# Patient Record
Sex: Female | Born: 1965 | Race: White | Hispanic: No | Smoking: Former smoker
Health system: Southern US, Community
[De-identification: ages and names within clinical notes are randomized; demographics above are authoritative.]

## PROBLEM LIST (undated history)

## (undated) DIAGNOSIS — J189 Pneumonia, unspecified organism: Secondary | ICD-10-CM

## (undated) DIAGNOSIS — E079 Disorder of thyroid, unspecified: Secondary | ICD-10-CM

## (undated) DIAGNOSIS — K219 Gastro-esophageal reflux disease without esophagitis: Secondary | ICD-10-CM

## (undated) DIAGNOSIS — F32A Depression, unspecified: Secondary | ICD-10-CM

## (undated) DIAGNOSIS — L900 Lichen sclerosus et atrophicus: Secondary | ICD-10-CM

## (undated) DIAGNOSIS — M549 Dorsalgia, unspecified: Secondary | ICD-10-CM

## (undated) DIAGNOSIS — M1990 Unspecified osteoarthritis, unspecified site: Secondary | ICD-10-CM

## (undated) DIAGNOSIS — N903 Dysplasia of vulva, unspecified: Secondary | ICD-10-CM

## (undated) HISTORY — DX: Dysplasia of vulva, unspecified: N90.3

## (undated) HISTORY — PX: ABDOMINOPLASTY: SUR9

## (undated) HISTORY — PX: MENISCUS REPAIR: SHX5179

## (undated) HISTORY — PX: ECTOPIC PREGNANCY SURGERY: SHX613

## (undated) HISTORY — PX: ENDOMETRIAL ABLATION: SHX621

## (undated) HISTORY — PX: TUBAL LIGATION: SHX77

## (undated) HISTORY — DX: Dorsalgia, unspecified: M54.9

## (undated) HISTORY — DX: Disorder of thyroid, unspecified: E07.9

## (undated) HISTORY — DX: Lichen sclerosus et atrophicus: L90.0

---

## 1995-11-24 HISTORY — PX: ECTOPIC PREGNANCY SURGERY: SHX613

## 2002-11-23 HISTORY — PX: MENISCUS REPAIR: SHX5179

## 2011-11-24 HISTORY — PX: ENDOMETRIAL ABLATION: SHX621

## 2011-11-24 HISTORY — PX: TUBAL LIGATION: SHX77

## 2015-01-07 ENCOUNTER — Emergency Department (HOSPITAL_COMMUNITY)
Admission: EM | Admit: 2015-01-07 | Discharge: 2015-01-07 | Disposition: A | Discharge status: Home / Self Care | Payer: Managed Care, Other (non HMO) | Attending: Emergency Medicine | Admitting: Emergency Medicine

## 2015-01-07 ENCOUNTER — Encounter (HOSPITAL_COMMUNITY): Payer: Self-pay | Admitting: Emergency Medicine

## 2015-01-07 DIAGNOSIS — Y9289 Other specified places as the place of occurrence of the external cause: Secondary | ICD-10-CM | POA: Diagnosis not present

## 2015-01-07 DIAGNOSIS — Y9389 Activity, other specified: Secondary | ICD-10-CM | POA: Insufficient documentation

## 2015-01-07 DIAGNOSIS — Y998 Other external cause status: Secondary | ICD-10-CM | POA: Diagnosis not present

## 2015-01-07 DIAGNOSIS — X58XXXA Exposure to other specified factors, initial encounter: Secondary | ICD-10-CM | POA: Insufficient documentation

## 2015-01-07 DIAGNOSIS — T7840XA Allergy, unspecified, initial encounter: Secondary | ICD-10-CM | POA: Insufficient documentation

## 2015-01-07 MED ORDER — SODIUM CHLORIDE 0.9 % IV BOLUS (SEPSIS)
1000.0000 mL | Freq: Once | INTRAVENOUS | Status: AC
  Administered 2015-01-07: 1000 mL via INTRAVENOUS

## 2015-01-07 MED ORDER — PREDNISONE 20 MG PO TABS: 40.0000 mg | ORAL_TABLET | Freq: Every day | ORAL | Status: DC

## 2015-01-07 MED ORDER — HYDROXYZINE HCL 25 MG PO TABS: 25.0000 mg | ORAL_TABLET | Freq: Four times a day (QID) | ORAL | Status: DC | PRN

## 2015-01-07 MED ORDER — DIPHENHYDRAMINE HCL 50 MG/ML IJ SOLN
50.0000 mg | Freq: Once | INTRAMUSCULAR | Status: AC
  Administered 2015-01-07: 50 mg via INTRAVENOUS
  Filled 2015-01-07: qty 1

## 2015-01-07 MED ORDER — FAMOTIDINE 20 MG PO TABS: 20.0000 mg | ORAL_TABLET | Freq: Two times a day (BID) | ORAL | Status: DC

## 2015-01-07 MED ORDER — FAMOTIDINE IN NACL 20-0.9 MG/50ML-% IV SOLN
20.0000 mg | Freq: Once | INTRAVENOUS | Status: AC
  Administered 2015-01-07: 20 mg via INTRAVENOUS
  Filled 2015-01-07: qty 50

## 2015-01-07 MED ORDER — METHYLPREDNISOLONE SODIUM SUCC 125 MG IJ SOLR
125.0000 mg | Freq: Once | INTRAMUSCULAR | Status: AC
  Administered 2015-01-07: 125 mg via INTRAVENOUS
  Filled 2015-01-07: qty 2

## 2015-01-07 NOTE — Progress Notes (Signed)
  CARE MANAGEMENT ED NOTE 01/07/2015  Patient:  Willetts,Nikaya   Account Number:  [id number]  Date Initiated:  01/07/2015  Documentation initiated by:  Edd ArbourGIBBS,KIMBERLY  Subjective/Objective Assessment:   49 yr old [employer] managed allergic reaction to something-states she feels her ears and eyes are swelling and feels her throat is irritated-itching all over-states she finished Bactrim three days ago for a cyst-also states she is feeling like     Subjective/Objective Assessment Detail:   no  pcp  pt states she use to work at [employer] now at [employer]     Action/Plan:   spoke with pt see notes below   Action/Plan Detail:   Anticipated DC Date:  01/07/2015     Status Recommendation to Physician:   Result of Recommendation:    Other ED Services  Consult Working Plan    DC Planning Services  Other  Outpatient Services - Pt will follow up  PCP issues    Choice offered to / List presented to:            Status of service:  Completed, signed off  ED Comments:   ED Comments Detail:  [facility] ED CM spoke with pt on how to obtain an in network pcp with insurance coverage via the customer service number or web site Cm reviewed ED level of care for crisis/emergent services and community pcp level of care to manage continuous or chronic medical concerns.  The pt voiced understanding CM encouraged pt and discussed pt's responsibility to verify with pt's insurance carrier that any recommended medical provider offered by any emergency room or a hospital provider is within the carrier's network. The pt voiced understanding

## 2015-01-07 NOTE — Progress Notes (Deleted)
[facility] to check to see if pt had medicare or medicaid services prior to d/c after re reviewing CM notes from 01/03/15

## 2015-01-07 NOTE — ED Provider Notes (Signed)
CSN: [id number]     Arrival date & time 01/07/15  6213 History   First MD Initiated Contact with Patient 01/07/15 [phone]     Chief Complaint  Patient presents with  . Allergic Reaction     (Consider location/radiation/quality/duration/timing/severity/associated sxs/prior Treatment) HPI  Carolyn White is a 49 y.o. female complaining of diffuse pruritus and hives onset yesterday she woke up this morning and the rash and pruritus had worsened in addition she is having eye swelling and she describes a sore throat similar to when you have a cold however she has no rhinorrhea, fever, chills, other respiratory symptoms. She also states that she feels like she has a basketball in her epigastrium, she denies nausea, vomiting, dyspepsia, shortness of breath or wheezing. No prior history of anaphylactic reaction. Patient finished a ten-day course of Bactrim for a pilonidal cyst 3 days ago.  History reviewed. No pertinent past medical history. History reviewed. No pertinent past surgical history. No family history on file. History  Substance Use Topics  . Smoking status: Not on file  . Smokeless tobacco: Not on file  . Alcohol Use: Not on file   OB History    No data available     Review of Systems  10 systems reviewed and found to be negative, except as noted in the HPI.   Allergies  Review of patient's allergies indicates not on file.  Home Medications   Prior to Admission medications   Not on File   BP 155/89 mmHg  Pulse 94  Temp(Src) 98.1 F (36.7 C)  Resp 16  SpO2 100% Physical Exam  Constitutional: She is oriented to person, place, and time. She appears well-developed and well-nourished. No distress.  HENT:  Head: Normocephalic and atraumatic.  Mouth/Throat: Oropharynx is clear and moist.  Eyes: Conjunctivae and EOM are normal. Pupils are equal, round, and reactive to light.  Neck: Normal range of motion.  Cardiovascular: Normal rate, regular rhythm and intact distal pulses.    Pulmonary/Chest: Effort normal and breath sounds normal. No stridor. No respiratory distress. She has no wheezes. She has no rales. She exhibits no tenderness.  No stridor or drooling. No posterior pharynx edema, lip or tongue swelling. Pt reclining comfortably, speaking in complete sentences.   No Wheezing, excellent air movement in all fields.     Abdominal: Soft. Bowel sounds are normal. She exhibits no distension and no mass. There is no tenderness. There is no rebound and no guarding.  Musculoskeletal: Normal range of motion.  Neurological: She is alert and oriented to person, place, and time.  Skin: Skin is warm and dry. Rash noted.  Hives over torso and all 4 extremities.   No warmth, TTP or discharge. Lesions are blanchable; sparing palms, soles and mucous membranes   Psychiatric: She has a normal mood and affect. Her behavior is normal.  Nursing note and vitals reviewed.   ED Course  Procedures (including critical care time) Labs Review Labs Reviewed - No data to display  Imaging Review No results found.   EKG Interpretation None      MDM   Final diagnoses:  Allergic reaction, initial encounter    Filed Vitals:   01/07/15 0836  BP: 155/89  Pulse: 94  Temp: 98.1 F (36.7 C)  Resp: 16  SpO2: 100%    Medications  sodium chloride 0.9 % bolus 1,000 mL (1,000 mLs Intravenous New Bag/Given 01/07/15 0906)  famotidine (PEPCID) IVPB 20 mg (20 mg Intravenous New Bag/Given 01/07/15 0906)  methylPREDNISolone sodium  succinate (SOLU-MEDROL) 125 mg/2 mL injection 125 mg (125 mg Intravenous Given 01/07/15 0906)  diphenhydrAMINE (BENADRYL) injection 50 mg (50 mg Intravenous Given 01/07/15 0906)    Carolyn White is a pleasant 49 y.o. female presenting with worsening hives, eye swelling. She reports a sore throat, allergen unclear she finished a ten-day course of Bactrim 3 days ago. Plan is IV fluids, Solu-Medrol and Pepcid in addition to Benadryl.  Patient reassessed after  medications and fluid bolus, she reports that she is sleepy but feels much better, rash has essentially resolved, lung sounds remained clear to auscultation, airways remains intact. Patient states that she drove herself here, we'll keep her in the ED and she until she is more alert and able to drive.   Patient's husband will come to pick her up.  Evaluation does not show pathology that would require ongoing emergent intervention or inpatient treatment. Pt is hemodynamically stable and mentating appropriately. Discussed findings and plan with patient/guardian, who agrees with care plan. All questions answered. Return precautions discussed and outpatient follow up given.   New Prescriptions   FAMOTIDINE (PEPCID) 20 MG TABLET    Take 1 tablet (20 mg total) by mouth 2 (two) times daily.   HYDROXYZINE (ATARAX/VISTARIL) 25 MG TABLET    Take 1 tablet (25 mg total) by mouth every 6 (six) hours as needed for itching.   PREDNISONE (DELTASONE) 20 MG TABLET    Take 2 tablets (40 mg total) by mouth daily.         Wynetta Emeryicole Nohea Kras, PA-C 01/07/15 1101  Suzi RootsKevin E Steinl, MD 01/08/15 [phone]

## 2015-01-07 NOTE — ED Notes (Addendum)
Per pt, states she is having a allergic reaction to something-states she feels her ears and eyes are swelling and feels her throat is irritated-itching all over-states she finished Bactrim three days ago for a cyst-also states she is feeling like she was getting a cold Thurs and Fri

## 2015-01-07 NOTE — Discharge Instructions (Signed)
You may apply a topical hydrocortisone ointment to all affected areas except for the face.   Do not hesitate to call 911 or return to the emergency room if you develop any shortness of breath, wheezing, tongue or lip swelling.  Do not hesitate to return to the emergency room for any new, worsening or concerning symptoms.  Please obtain primary care using resource guide below. But the minute you were seen in the emergency room and that they will need to obtain records for further outpatient management.    Emergency Department Resource Guide 1) Find a Doctor and Pay Out of Pocket Although you won't have to find out who is covered by your insurance plan, it is a good idea to ask around and get recommendations. You will then need to call the office and see if the doctor you have chosen will accept you as a new patient and what types of options they offer for patients who are self-pay. Some doctors offer discounts or will set up payment plans for their patients who do not have insurance, but you will need to ask so you aren't surprised when you get to your appointment.  2) Contact Your Local Health Department Not all health departments have doctors that can see patients for sick visits, but many do, so it is worth a call to see if yours does. If you don't know where your local health department is, you can check in your phone book. The CDC also has a tool to help you locate your state's health department, and many state websites also have listings of all of their local health departments.  3) Find a Walk-in Clinic If your illness is not likely to be very severe or complicated, you may want to try a walk in clinic. These are popping up all over the country in pharmacies, drugstores, and shopping centers. They're usually staffed by nurse practitioners or physician assistants that have been trained to treat common illnesses and complaints. They're usually fairly quick and inexpensive. However, if you have  serious medical issues or chronic medical problems, these are probably not your best option.  No Primary Care Doctor: - Call Health Connect at  [phone] - they can help you locate a primary care doctor that  accepts your insurance, provides certain services, etc. - Physician Referral Service- [phone]  Chronic Pain Problems: Organization         Address  Phone   Notes  [facility]  [phone] Patients need to be referred by their primary care doctor.   Medication Assistance: Organization         Address  Phone   Notes  [facility] Medication [facility] [address] [place], [place] [id number] [phone] --Must be a resident of [facility] -- Must have NO insurance coverage whatsoever (no Medicaid/ Medicare, etc.) -- The pt. MUST have a primary care doctor that directs their care regularly and follows them in the community   MedAssist  [phone]   [employer]  [phone]    Agencies that provide inexpensive medical care: Organization         Address  Phone   Notes  [facility]  [phone]   [facility]    [phone]   [facility] [address] [place], [place] [address] [phone]   [facility] [address], [place] [phone]   Planned Parenthood    (  [phone]   [facility]    [phone]   [facility]  [address], [place] Phone:  [phone], Fax:  [phone] Hours of Operation:  9 am - 6 pm, M-F.  Also accepts Medicaid/Medicare and self-pay.  [facility] for [place], [address], [place] Phone: [phone], Fax: [phone]. Hours of Operation:  8:30 am - 5:30 pm, M-F.  Also accepts Medicaid and self-pay.  [facility] [address] [place] Phone: [phone]   [place],  [place], [place] [phone], Ext. [phone] Mondays & Thursdays: 7-9 AM.  First 15 patients are seen on a first come, first serve basis.    [place] Providers:  Organization         Address  Phone   Notes  [facility] [address], [place] [phone] Also accepts self-pay patients.  [facility] [id number] [place], [place]  [phone]   [place], [address], [place] [phone]   [facility] [address], [place] [phone]   Lucianne Lei [address], [place]   [phone] Only accepts [place] Access [place] patients after they have their name applied to their card.   Self-Pay (no insurance) in [facility]:  Organization         Address  Phone   Notes  Sickle Cell Patients, [facility] [place] [phone]   [facility] [place] [phone]   [facility] [place]  [place], [place], [place] [phone]   [facility]/Dr. Osei-Bonsu  [address], [place] or [address], [place] [phone] Phone number for both [place] and [place] locations is the same.  Urgent Medical and [facility] [address] [place] [phone]   [facility] [address] [place] or [address] [phone]   [facility] [address] [place] [phone], phone; [phone], fax Sees patients 1st and 3rd Saturday of every month.  Must not qualify for public or private insurance (i.e. Medicaid, Medicare, [place] Health Choice, Veterans' Benefits)  Household income should be no more than 200% of the poverty level The clinic cannot treat you if you are pregnant or think you are pregnant   Sexually transmitted diseases are not treated at the clinic.    Dental Care: Organization         Address  Phone  Notes  [facility] [phone] Accepts children up to age 37 who are enrolled in [place] or [place]; pregnant women with a Medicaid card; and children who have applied for Medicaid or [place] Health Choice, but were declined, whose parents can pay a reduced fee at time of service.  [facility]  [address], [place] [phone] Accepts children up to age 15 who are enrolled in [place] or [place]; pregnant women with a Medicaid card; and children who have applied for Medicaid or [place], but were declined, whose parents can pay a  reduced fee at time of service.  [place] Adult Dental Access PROGRAM  [place] [phone] Patients are seen by appointment only. Walk-ins are not accepted. [place] will see patients 74 years of age and older. Monday - Tuesday (8am-5pm) Most Wednesdays (8:30-5pm) $30 per visit, cash only  [facility] Adult Dental Access PROGRAM  [address], [facility] [phone] Patients are seen by appointment only. Walk-ins are not accepted. [place] will see patients 35 years of age and older. One Wednesday Evening (Monthly: Volunteer Based).  $30 per visit, cash only  [place]  [phone] for adults; Children under age 59, call Graduate Pediatric Dentistry at [phone]. Children aged 51-14, please call [phone] to request a pediatric application.  Dental services are provided in all areas of dental care including fillings, crowns and bridges, complete and partial dentures, implants, gum treatment, root canals, and extractions. Preventive care is also provided. Treatment is provided to both adults and children. Patients  are selected via a lottery and there is often a waiting list.   [facility] [address] [place]  [phone] [email or web address]   [facility] [address] [place], [place] [phone], Ext. [phone] Second and Fourth Thursday of each month, opens at 6:30 AM; Clinic ends at 9 AM.  Patients are seen on a first-come first-served basis, and a limited number are seen during each clinic.   [facility]  [address] Hillard Danker [place], [place] [phone]   Eligibility Requirements You must have lived in [place], [place], or [place] counties for at least the last three months.   You cannot be eligible for state or federal sponsored [employer], including [employer], [place], or [employer].   You generally cannot be eligible for healthcare insurance through your employer.    How to apply: Eligibility screenings are held every Tuesday and Wednesday afternoon from 1:00 pm until 4:00 pm. You do not need an appointment for the interview!  [facility] [address] [place], [place]   [place]  [place] Department  [place]  [phone]    Behavioral Health Resources in the Community: Intensive Outpatient Programs Organization         Address  Phone  Notes  [place]. [address], [place], [place] [phone]   [facility]patient [address], [place], [place]   ADS: Alcohol & Drug Svcs [address] [place], [place]   Centre [address],  [place], [place] or [phone]   Substance Abuse Resources Organization         Address  Phone  Notes  Alcohol and Drug Services  7871029858   [place]  [phone]   The [place]   Chinita Pester  [phone]    Residential & Outpatient Substance Abuse Program  [phone]   Psychological Services Organization         Address  Phone  Notes  [facility]  [place]  [phone]   [place] [address], [place] or [phone]    Mobile Crisis Teams Organization         Address  Phone  Notes  [facility], Mobile Crisis Care Unit  [phone]  Assertive Psychotherapeutic Services  [address] [place], [place]   [facility] [address] [place] [phone]    Self-Help/Support Groups Organization         Address  Phone             Notes  [facility] [place] - variety of support groups  [place] Call for more information  Narcotics Anonymous (NA), Caring Services [address], [employer] [place]  2 meetings at this location   Special educational needs teacher         Address  Phone  Notes  ASAP Residential Treatment [place],    [place]  [phone]   [facility]  [address] [place] [phone], [place], [place]   [place], [place] [phone] Admissions: 8am-3pm M-F  Incentives Substance [place] [address],    [place], [place] [phone]   [facility] [address] [place], [place], [place]   [facility] [address]  [place], [place]   [facility] - Intensive Outpatient [address], [place], [place]   [facility] ([place].) [place].,  [place], [place] [phone] or [phone]   [facility] Treatment Services (RTS) [address], [place], [place] Accepts Medicaid  [facility] [place] [address]  [place] [phone] Substance Abuse/Addiction Treatment   [facility] Organization         Address  Phone  Notes  [facility]  [phone]   Domenic Schwab, PhD [address] Arlis Porta [place], [place]   [phone] or [phone]   [place], [place] [phone]   [facility] [address] [place], [place] [phone] Insurance/Medicaid/sponsorship through [facility] and Families [address], [address] [place]                                    [place], [place] [phone] [place] [address], [place] [phone]    Dr. Adele Schilder  [phone]   [facility] Dept. 1) [address], [place] 2) [place] 3)  [place] [address], [place] [phone]  [phone]   [place] [phone] or [phone] (After Hours)

## 2019-08-03 ENCOUNTER — Other Ambulatory Visit: Payer: Self-pay

## 2019-08-03 ENCOUNTER — Encounter (HOSPITAL_COMMUNITY): Payer: Self-pay | Admitting: Emergency Medicine

## 2019-08-03 ENCOUNTER — Ambulatory Visit (HOSPITAL_COMMUNITY)
Admission: EM | Admit: 2019-08-03 | Discharge: 2019-08-03 | Disposition: A | Discharge status: Home / Self Care | Payer: Self-pay | Attending: Internal Medicine | Admitting: Internal Medicine

## 2019-08-03 ENCOUNTER — Ambulatory Visit (INDEPENDENT_AMBULATORY_CARE_PROVIDER_SITE_OTHER): Payer: Self-pay

## 2019-08-03 DIAGNOSIS — R197 Diarrhea, unspecified: Secondary | ICD-10-CM | POA: Insufficient documentation

## 2019-08-03 LAB — COMPREHENSIVE METABOLIC PANEL
ALT: 18 U/L (ref 0–44)
AST: 25 U/L (ref 15–41)
Albumin: 4.2 g/dL (ref 3.5–5.0)
Alkaline Phosphatase: 74 U/L (ref 38–126)
Anion gap: 9 (ref 5–15)
BUN: 8 mg/dL (ref 6–20)
CO2: 27 mmol/L (ref 22–32)
Calcium: 9.6 mg/dL (ref 8.9–10.3)
Chloride: 103 mmol/L (ref 98–111)
Creatinine, Ser: 0.83 mg/dL (ref 0.44–1.00)
GFR calc Af Amer: >60 mL/min (ref >60)
GFR calc non Af Amer: >60 mL/min (ref >60)
Glucose, Bld: 104 mg/dL — ABNORMAL HIGH (ref 70–99)
Potassium: 4.3 mmol/L (ref 3.5–5.1)
Sodium: 139 mmol/L (ref 135–145)
Total Bilirubin: 1.3 mg/dL — ABNORMAL HIGH (ref 0.3–1.2)
Total Protein: 7.4 g/dL (ref 6.5–8.1)

## 2019-08-03 LAB — CBC WITH DIFFERENTIAL/PLATELET
Abs Immature Granulocytes: 0.04 10*3/uL (ref 0.00–0.07)
Basophils Absolute: 0 10*3/uL (ref 0.0–0.1)
Basophils Relative: 0 %
Eosinophils Absolute: 0.4 10*3/uL (ref 0.0–0.5)
Eosinophils Relative: 4 %
HCT: 45.4 % (ref 36.0–46.0)
Hemoglobin: 15.2 g/dL — ABNORMAL HIGH (ref 12.0–15.0)
Immature Granulocytes: 0 %
Lymphocytes Relative: 29 %
Lymphs Abs: 2.8 10*3/uL (ref 0.7–4.0)
MCH: 30.4 pg (ref 26.0–34.0)
MCHC: 33.5 g/dL (ref 30.0–36.0)
MCV: 90.8 fL (ref 80.0–100.0)
Monocytes Absolute: 0.9 10*3/uL (ref 0.1–1.0)
Monocytes Relative: 9 %
Neutro Abs: 5.6 10*3/uL (ref 1.7–7.7)
Neutrophils Relative %: 58 %
Platelets: 340 10*3/uL (ref 150–400)
RBC: 5 MIL/uL (ref 3.87–5.11)
RDW: 12.2 % (ref 11.5–15.5)
WBC: 9.8 10*3/uL (ref 4.0–10.5)
nRBC: 0 % (ref 0.0–0.2)

## 2019-08-03 LAB — POCT URINALYSIS DIP (DEVICE)
Bilirubin Urine: NEGATIVE
Glucose, UA: NEGATIVE mg/dL
Ketones, ur: NEGATIVE mg/dL
Leukocytes,Ua: NEGATIVE
Nitrite: NEGATIVE
Protein, ur: NEGATIVE mg/dL
Specific Gravity, Urine: >=1.03 (ref 1.005–1.030)
Urobilinogen, UA: 0.2 mg/dL (ref 0.0–1.0)
pH: 5.5 (ref 5.0–8.0)

## 2019-08-03 MED ORDER — KETOROLAC TROMETHAMINE 30 MG/ML IJ SOLN
30.0000 mg | Freq: Once | INTRAMUSCULAR | Status: AC
Start: 2019-08-03 — End: 2019-08-03
  Administered 2019-08-03: 30 mg via INTRAMUSCULAR

## 2019-08-03 MED ORDER — DICYCLOMINE HCL 20 MG PO TABS: 20.0000 mg | ORAL_TABLET | Freq: Two times a day (BID) | ORAL | 0 refills | Status: DC

## 2019-08-03 MED ORDER — ONDANSETRON 4 MG PO TBDP
4.0000 mg | ORAL_TABLET | Freq: Once | ORAL | Status: AC
  Administered 2019-08-03: 4 mg via ORAL

## 2019-08-03 MED ORDER — ONDANSETRON 4 MG PO TBDP: 4.0000 mg | ORAL_TABLET | Freq: Three times a day (TID) | ORAL | 0 refills | Status: DC | PRN

## 2019-08-03 MED ORDER — KETOROLAC TROMETHAMINE 30 MG/ML IJ SOLN
INTRAMUSCULAR | Status: AC
  Filled 2019-08-03: qty 1

## 2019-08-03 MED ORDER — ONDANSETRON 4 MG PO TBDP
ORAL_TABLET | ORAL | Status: AC
  Filled 2019-08-03: qty 1

## 2019-08-03 NOTE — ED Notes (Signed)
Stool sample in lab

## 2019-08-03 NOTE — Discharge Instructions (Addendum)
Your x-ray did not show any problems. I am still waiting on your lab results that are pending. We will send her stool for testing. Give you something here for pain and nausea. Sending [place] a medicine to the pharmacy to help with abdominal cramping Once I get your lab results back we may be treating you with antibiotics based on what we find in the stool. Make sure you are drinking fluids with electrolytes to stay hydrated Small meals if able but nothing spicy, greasy or fatty.

## 2019-08-03 NOTE — ED Notes (Signed)
Patient in bathroom obtaining stool sample

## 2019-08-03 NOTE — ED Triage Notes (Signed)
diarrhea and vomiting for 3 weeks.  Diarrhea and pain has been particularly bad in the last 3 days.  Water stool over the past 3 days.  Patient is hurting in abdomen, generalized. Denies urinary symptoms.   No fever Patient has had chills.   Diarrhea- reports 6 episodes in the last hour  Vomiting-2 episodes, but remains nauseated

## 2019-08-03 NOTE — ED Provider Notes (Signed)
[place]    CSN: [id number] Arrival date & time: 08/03/19  1001      History   Chief Complaint Chief Complaint  Patient presents with  . Diarrhea  . Vomiting    HPI Carolyn White is a 53 y.o. female.   Patient is a 53 year old female with no significant past medical history.  She is presenting today with vomiting and diarrhea x3 weeks.  The problem has been waxing and waning.  The problem has worsened over the last 3 days.  Reporting watery stools and severe abdominal cramping.  No associated fever but has had some mild body aches and chills.  She has had 6 episodes of watery diarrhea in the last hour.  One episode here today.  2 episodes of vomiting today with continued nausea.  Denies any blood in vomit or stool.  She has had loss of appetite but has been sipping fluids.  Denies any recent traveling, recent sick contacts or recent antibiotic use.  ROS per HPI      History reviewed. No pertinent past medical history.  There are no active problems to display for this patient.   Past Surgical History:  Procedure Laterality Date  . CESAREAN SECTION    . ECTOPIC PREGNANCY SURGERY    . ENDOMETRIAL ABLATION    . MENISCUS REPAIR Left   . TUBAL LIGATION      OB History   No obstetric history on file.      Home Medications    Prior to Admission medications   Medication Sig Start Date End Date Taking? Authorizing Provider  famotidine (PEPCID) 20 MG tablet Take 1 tablet (20 mg total) by mouth 2 (two) times daily. 01/07/15  Yes Pisciotta, Elmyra Ricks, PA-C  dicyclomine (BENTYL) 20 MG tablet Take 1 tablet (20 mg total) by mouth 2 (two) times daily. 08/03/19   Tailyn Hantz, Tressia Miners A, NP  ondansetron (ZOFRAN ODT) 4 MG disintegrating tablet Take 1 tablet (4 mg total) by mouth every 8 (eight) hours as needed for nausea or vomiting. 08/03/19   Orvan July, NP    Family History Family History  Problem Relation Age of Onset  . Irritable bowel syndrome Mother     Social  History Social History   Tobacco Use  . Smoking status: Current Every Day Smoker  Substance Use Topics  . Alcohol use: Never    Frequency: Never  . Drug use: Never     Allergies   Bactrim [sulfamethoxazole-trimethoprim]   Review of Systems Review of Systems   Physical Exam Triage Vital Signs ED Triage Vitals  Enc Vitals Group     BP 08/03/19 1016 131/82     Pulse Rate 08/03/19 1016 83     Resp 08/03/19 1016 16     Temp 08/03/19 1016 98.1 F (36.7 C)     Temp Source 08/03/19 1016 Temporal     SpO2 08/03/19 1016 100 %     Weight --      Height --      Head Circumference --      Peak Flow --      Pain Score 08/03/19 1021 6     Pain Loc --      Pain Edu? --      Excl. in [place]? --    No data found.  Updated Vital Signs BP 131/82 (BP Location: Left Arm)   Pulse 83   Temp 98.1 F (36.7 C) (Temporal)   Resp 16   SpO2 100%  Visual Acuity Right Eye Distance:   Left Eye Distance:   Bilateral Distance:    Right Eye Near:   Left Eye Near:    Bilateral Near:     Physical Exam Constitutional:      General: She is not in acute distress.    Appearance: She is ill-appearing. She is not toxic-appearing or diaphoretic.  HENT:     Head: Normocephalic and atraumatic.     Nose: Nose normal.  Eyes:     Conjunctiva/sclera: Conjunctivae normal.  Neck:     Musculoskeletal: Normal range of motion.  Cardiovascular:     Rate and Rhythm: Normal rate and regular rhythm.     Pulses: Normal pulses.     Heart sounds: Normal heart sounds.  Pulmonary:     Effort: Pulmonary effort is normal.     Breath sounds: Normal breath sounds.  Abdominal:     General: Abdomen is flat. Bowel sounds are normal. There is no distension. There are no signs of injury.     Palpations: Abdomen is soft. There is no shifting dullness, fluid wave, hepatomegaly, splenomegaly, mass or pulsatile mass.     Tenderness: There is generalized abdominal tenderness. There is no right CVA tenderness, left CVA  tenderness, guarding or rebound. Negative signs include Murphy's sign.     Hernia: No hernia is present.  Neurological:     Mental Status: She is alert.      UC Treatments / Results  Labs (all labs ordered are listed, but only abnormal results are displayed) Labs Reviewed  CBC WITH DIFFERENTIAL/PLATELET - Abnormal; Notable for the following components:      Result Value   Hemoglobin 15.2 (*)    All other components within normal limits  COMPREHENSIVE METABOLIC PANEL - Abnormal; Notable for the following components:   Glucose, Bld 104 (*)    Total Bilirubin 1.3 (*)    All other components within normal limits  POCT URINALYSIS DIP (DEVICE) - Abnormal; Notable for the following components:   Hgb urine dipstick TRACE (*)    All other components within normal limits  GASTROINTESTINAL PANEL BY PCR, STOOL (REPLACES STOOL CULTURE)    EKG   Radiology Dg Abd 2 Views  Result Date: 08/03/2019 CLINICAL DATA:  Nausea, vomiting and diarrhea 3 weeks. Evaluate for bowel obstruction. EXAM: ABDOMEN - 2 VIEW COMPARISON:  None. FINDINGS: Examination demonstrates air throughout the colon with a few scattered colonic air-fluid levels. No evidence of dilated small bowel loops. No free peritoneal air. Bones and soft tissues are unremarkable. IMPRESSION: Nonobstructive bowel gas pattern. Electronically Signed   By: Elberta Fortis M.D.   On: 08/03/2019 11:08    Procedures Procedures (including critical care time)  Medications Ordered in UC Medications  ketorolac (TORADOL) 30 MG/ML injection 30 mg (30 mg Intramuscular Given 08/03/19 1157)  ondansetron (ZOFRAN-ODT) disintegrating tablet 4 mg (4 mg Oral Given 08/03/19 1157)  ketorolac (TORADOL) 30 MG/ML injection (has no administration in time range)  ondansetron (ZOFRAN-ODT) 4 MG disintegrating tablet (has no administration in time range)    Initial Impression / Assessment and Plan / UC Course  I have reviewed the triage vital signs and the nursing  notes.  Pertinent labs & imaging results that were available during my care of the patient were reviewed by me and considered in my medical decision making (see chart for details).  Clinical Course as of Aug 05 847  Fri Aug 04, 2019  1556 MCV: 90.8 [TB]  1607 Cyclospora cayetanensis: NOT DETECTED [  TB]    Clinical Course User Index [TB] Janace ArisBast, Lindell Renfrew A, NP    Pt is a 53 year old female that presents with 3 weeks of worsening watery diarrhea and abdominal cramping.  X ray normal without obstruction.  Lab work unremarkable Stool sample with cryptosporidium in stool.  Spoke with ID and they recommended no treatment. This is typically self limiting and treat the symptoms. Bentyl given for symptoms and recommended trying imodium. Stay hydrated   Spoke with patient on the phone about potential sources. Sources are reportable to the health department.  If symptoms worsen she will need to follow up. Sometimes ivermectin is warranted.   Final Clinical Impressions(s) / UC Diagnoses   Final diagnoses:  Diarrhea of presumed infectious origin     Discharge Instructions     Your x-ray did not show any problems. I am still waiting on your lab results that are pending. We will send her stool for testing. Give you something here for pain and nausea. Sending us a medicine to the pharmacy to help with abdominal cramping Once I get your lab results back we may be treating you with antibiotics based on what we find in the stool. Make sure you are drinking fluids with electrolytes to stay hydrated Small meals if able but nothing spicy, greasy or fatty.     ED Prescriptions    Medication Sig Dispense Auth. Provider   dicyclomine (BENTYL) 20 MG tablet Take 1 tablet (20 mg total) by mouth 2 (two) times daily. 20 tablet Latrelle Bazar A, NP   ondansetron (ZOFRAN ODT) 4 MG disintegrating tablet Take 1 tablet (4 mg total) by mouth every 8 (eight) hours as needed for nausea or vomiting. 20 tablet Janace ArisBast,  Erian Rosengren A, NP     Controlled Substance Prescriptions [place] Controlled Substance Registry consulted? no   Janace ArisBast, Taha Dimond A, NP 08/05/19 [phone]

## 2019-08-03 NOTE — ED Notes (Signed)
Patient requested bathroom, requested urine specimen

## 2019-08-04 ENCOUNTER — Telehealth (HOSPITAL_COMMUNITY): Payer: Self-pay

## 2019-08-04 LAB — GASTROINTESTINAL PANEL BY PCR, STOOL (REPLACES STOOL CULTURE)

## 2020-12-23 ENCOUNTER — Other Ambulatory Visit: Payer: Self-pay

## 2020-12-23 ENCOUNTER — Encounter (HOSPITAL_COMMUNITY): Payer: Self-pay

## 2020-12-23 ENCOUNTER — Emergency Department (HOSPITAL_COMMUNITY): Payer: No Typology Code available for payment source

## 2020-12-23 ENCOUNTER — Emergency Department (HOSPITAL_COMMUNITY)
Admission: EM | Admit: 2020-12-23 | Discharge: 2020-12-23 | Disposition: A | Discharge status: Home / Self Care | Payer: No Typology Code available for payment source | Attending: Emergency Medicine | Admitting: Emergency Medicine

## 2020-12-23 DIAGNOSIS — S0993XA Unspecified injury of face, initial encounter: Secondary | ICD-10-CM | POA: Diagnosis not present

## 2020-12-23 DIAGNOSIS — W01198A Fall on same level from slipping, tripping and stumbling with subsequent striking against other object, initial encounter: Secondary | ICD-10-CM | POA: Insufficient documentation

## 2020-12-23 DIAGNOSIS — S0990XA Unspecified injury of head, initial encounter: Secondary | ICD-10-CM

## 2020-12-23 DIAGNOSIS — F17200 Nicotine dependence, unspecified, uncomplicated: Secondary | ICD-10-CM | POA: Insufficient documentation

## 2020-12-23 DIAGNOSIS — M542 Cervicalgia: Secondary | ICD-10-CM | POA: Diagnosis not present

## 2020-12-23 DIAGNOSIS — Y9302 Activity, running: Secondary | ICD-10-CM | POA: Insufficient documentation

## 2020-12-23 MED ORDER — CYCLOBENZAPRINE HCL 10 MG PO TABS: 10.0000 mg | ORAL_TABLET | Freq: Two times a day (BID) | ORAL | 0 refills | Status: DC | PRN

## 2020-12-23 MED ORDER — CYCLOBENZAPRINE HCL 10 MG PO TABS
20.0000 mg | ORAL_TABLET | Freq: Once | ORAL | Status: AC
  Administered 2020-12-23: 20 mg via ORAL
  Filled 2020-12-23: qty 2
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
## 2020-12-23 NOTE — Discharge Instructions (Addendum)
seen here after a head injury.  Exam and imaging all looks reassuring.  Given you a a prescription for a muscle relaxer please take as prescribed.  Please beware this medication can cause you to become drowsy do consume alcohol or operate heavy machinery while taking this medication.  I recommend taking this at nighttime.  You may take over-the-counter pain medications like ibuprofen or Tylenol every 6 hours as needed.  I recommend brain rest, I would decrease screen time exercising and other brain stimulating activities as this can increase headaches.  I would slowly reintroduce these activities as tolerated.  You may follow-up with the concussion clinic and/or your primary care provider if symptoms not fully resolved in a week's time.  Come back to the emergency department if you develop chest pain, shortness of breath, severe abdominal pain, uncontrolled nausea, vomiting, diarrhea.
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
## 2021-12-28 ENCOUNTER — Encounter (HOSPITAL_COMMUNITY): Payer: Self-pay | Admitting: Emergency Medicine

## 2021-12-28 ENCOUNTER — Emergency Department (HOSPITAL_COMMUNITY)
Admission: EM | Admit: 2021-12-28 | Discharge: 2021-12-28 | Disposition: A | Discharge status: Home / Self Care | Payer: No Typology Code available for payment source | Attending: Emergency Medicine | Admitting: Emergency Medicine

## 2021-12-28 ENCOUNTER — Emergency Department (HOSPITAL_COMMUNITY): Payer: No Typology Code available for payment source

## 2021-12-28 ENCOUNTER — Other Ambulatory Visit: Payer: Self-pay

## 2021-12-28 DIAGNOSIS — W208XXA Other cause of strike by thrown, projected or falling object, initial encounter: Secondary | ICD-10-CM | POA: Insufficient documentation

## 2021-12-28 DIAGNOSIS — S92531A Displaced fracture of distal phalanx of right lesser toe(s), initial encounter for closed fracture: Secondary | ICD-10-CM | POA: Diagnosis not present

## 2021-12-28 DIAGNOSIS — S99921A Unspecified injury of right foot, initial encounter: Secondary | ICD-10-CM | POA: Diagnosis present

## 2021-12-28 DIAGNOSIS — S92501A Displaced unspecified fracture of right lesser toe(s), initial encounter for closed fracture: Secondary | ICD-10-CM

## 2021-12-28 MED ORDER — CEPHALEXIN 500 MG PO CAPS: 500.0000 mg | ORAL_CAPSULE | Freq: Four times a day (QID) | ORAL | 0 refills | Status: DC

## 2021-12-28 NOTE — ED Triage Notes (Signed)
Around 5pm, a 30 lbs log fell on the patient's right foot. The blood is controlled and swelling is noted.

## 2021-12-28 NOTE — ED Provider Notes (Signed)
[facility]-EMERGENCY DEPT Provider Note   CSN: [id number] Arrival date & time: 12/28/21  2104     History  No chief complaint on file.   Carolyn White is a 56 y.o. female who presents emergency department complaining of right foot pain after a piece of lumber fell on it in her garage at around 5 PM.  No other trauma noted.  HPI   History reviewed. No pertinent past medical history.   Home Medications Prior to Admission medications   Medication Sig Start Date End Date Taking? Authorizing Provider  cephALEXin (KEFLEX) 500 MG capsule Take 1 capsule (500 mg total) by mouth 4 (four) times daily. 12/28/21  Yes Kabe Mckoy T, PA-C  cyclobenzaprine (FLEXERIL) 10 MG tablet Take 1 tablet (10 mg total) by mouth 2 (two) times daily as needed for muscle spasms. 12/23/20   Carroll Sage, PA-C  dicyclomine (BENTYL) 20 MG tablet Take 1 tablet (20 mg total) by mouth 2 (two) times daily. 08/03/19   Dahlia Byes A, NP  famotidine (PEPCID) 20 MG tablet Take 1 tablet (20 mg total) by mouth 2 (two) times daily. 01/07/15   Pisciotta, Joni Reining, PA-C  ondansetron (ZOFRAN ODT) 4 MG disintegrating tablet Take 1 tablet (4 mg total) by mouth every 8 (eight) hours as needed for nausea or vomiting. 08/03/19   Janace Aris, NP      Allergies    Bactrim [sulfamethoxazole-trimethoprim]    Review of Systems   Review of Systems  Musculoskeletal:        Right foot pain  All other systems reviewed and are negative.  Physical Exam Updated Vital Signs BP 130/81    Pulse 68    Temp 98.4 F (36.9 C) (Oral)    Resp 18    SpO2 95%  Physical Exam Vitals and nursing note reviewed.  Constitutional:      Appearance: Normal appearance.  HENT:     Head: Normocephalic and atraumatic.  Eyes:     Conjunctiva/sclera: Conjunctivae normal.  Pulmonary:     Effort: Pulmonary effort is normal. No respiratory distress.  Musculoskeletal:     Comments: Ecchymoses noted to the palmar aspect of the right  fourth and fifth toes.  Ecchymosis noted to the dorsal aspect of the second through fifth toes.  Superficial laceration on the palmar aspect of the fourth toe, non-gaping.  Good capillary refill, sensation intact.  Decreased passive ROM of all toes due to pain.  Skin:    General: Skin is warm and dry.     Comments: Minimal bleeding noted from laceration on the fourth toe  Neurological:     Mental Status: She is alert.  Psychiatric:        Mood and Affect: Mood normal.        Behavior: Behavior normal.    ED Results / Procedures / Treatments   Labs (all labs ordered are listed, but only abnormal results are displayed) Labs Reviewed - No data to display  EKG None  Radiology DG Foot Complete Right  Result Date: 12/28/2021 CLINICAL DATA:  30 pound log fell on right foot. Laceration between the fourth and fifth toes. EXAM: RIGHT FOOT COMPLETE - 3+ VIEW COMPARISON:  None. FINDINGS: Fractures of the distal tufts of the fourth and fifth toes. No involvement of the inter phalangeal joints. The other bones of the foot appear unremarkable. Incidental plantar calcaneal spur. IMPRESSION: Fractures of the distal tufts of the fourth and fifth toes. Electronically Signed   By: Loraine Leriche  Shogry M.D.   On: 12/28/2021 22:13    Procedures Procedures    Medications Ordered in ED Medications - No data to display  ED Course/ Medical Decision Making/ A&P                           Medical Decision Making Risk Prescription drug management.   This patient is a 56 year old female who presents to the ED for concern of right foot pain after a piece of lumber about 30 pounds fell on it around 5 PM.  Physical Exam: Physical exam performed. The pertinent findings include: Ecchymosis noted to all toes, decreased passive ROM of all toes due to pain, good capillary refill and sensation, small laceration to the palmar aspect of the fourth toe.  Lab Tests/Imaging studies: I Ordered, and personally interpreted  labs/imaging including foot x-ray.  The pertinent results include: Fractures of the distal tuft of the fourth and fifth right toes. I agree with the radiologist interpretation.  Disposition: After consideration of the diagnostic results and the patients response to treatment, I feel that patient is not requiring mission or inpatient treatment for her symptoms.  Laceration does not appear large or gaping enough to require suture repair.  Provided basic wound care, and place patient in orthotic shoe.  Due to location of the laceration, and concern for difficulty keeping this area clean, will place patient on antibiotics to prevent any infection.  She has no comorbidities which would alter wound healing.  She is to follow-up with the orthopedic doctor later this week.  Discussed reasons to return to the emergency department, and the patient is agreeable to the plan.   Final Clinical Impression(s) / ED Diagnoses Final diagnoses:  Injury of right foot, initial encounter  Closed fracture of phalanx of right fifth toe, initial encounter  Closed fracture of phalanx of right fourth toe, initial encounter    Rx / DC Orders ED Discharge Orders          Ordered    cephALEXin (KEFLEX) 500 MG capsule  4 times daily        12/28/21 2242           Portions of this report may have been transcribed using voice recognition software. Every effort was made to ensure accuracy; however, inadvertent computerized transcription errors may be present.    Jeanella Flattery 12/28/21 2248    Mancel Bale, MD 12/29/21 1330

## 2021-12-28 NOTE — Discharge Instructions (Addendum)
You are seen in the emergency department today for foot pain.  As we discussed you broke your right fourth and fifth toes.  I have dressed your laceration, and we have placed your foot in orthotic shoe. I'd like you to try and keep the area cleaned and bandaged. I've prescribed you antibiotics to prevent infection in that wound, so please take this four times daily for the next 5 days.   Please use Tylenol or ibuprofen for pain.  You may use 600 mg ibuprofen every 6 hours or 1000 mg of Tylenol every 6 hours.  You may choose to alternate between the 2.  This would be most effective.  Do not exceed 4 g of Tylenol within 24 hours.  Do not exceed 3200 mg ibuprofen within 24 hours.  I've given you the contact information for the orthopedic doctors for you to call and make an appointment this week for follow up.

## 2022-10-03 IMAGING — CR DG FOOT COMPLETE 3+V*R*
3 series · 3 of 3 positions shown · non-contrast
Comparison: None.

CLINICAL DATA: 30 pound log fell on right foot. Laceration between
the fourth and fifth toes.

EXAM:
RIGHT FOOT COMPLETE - 3+ VIEW

[x foot ap right]
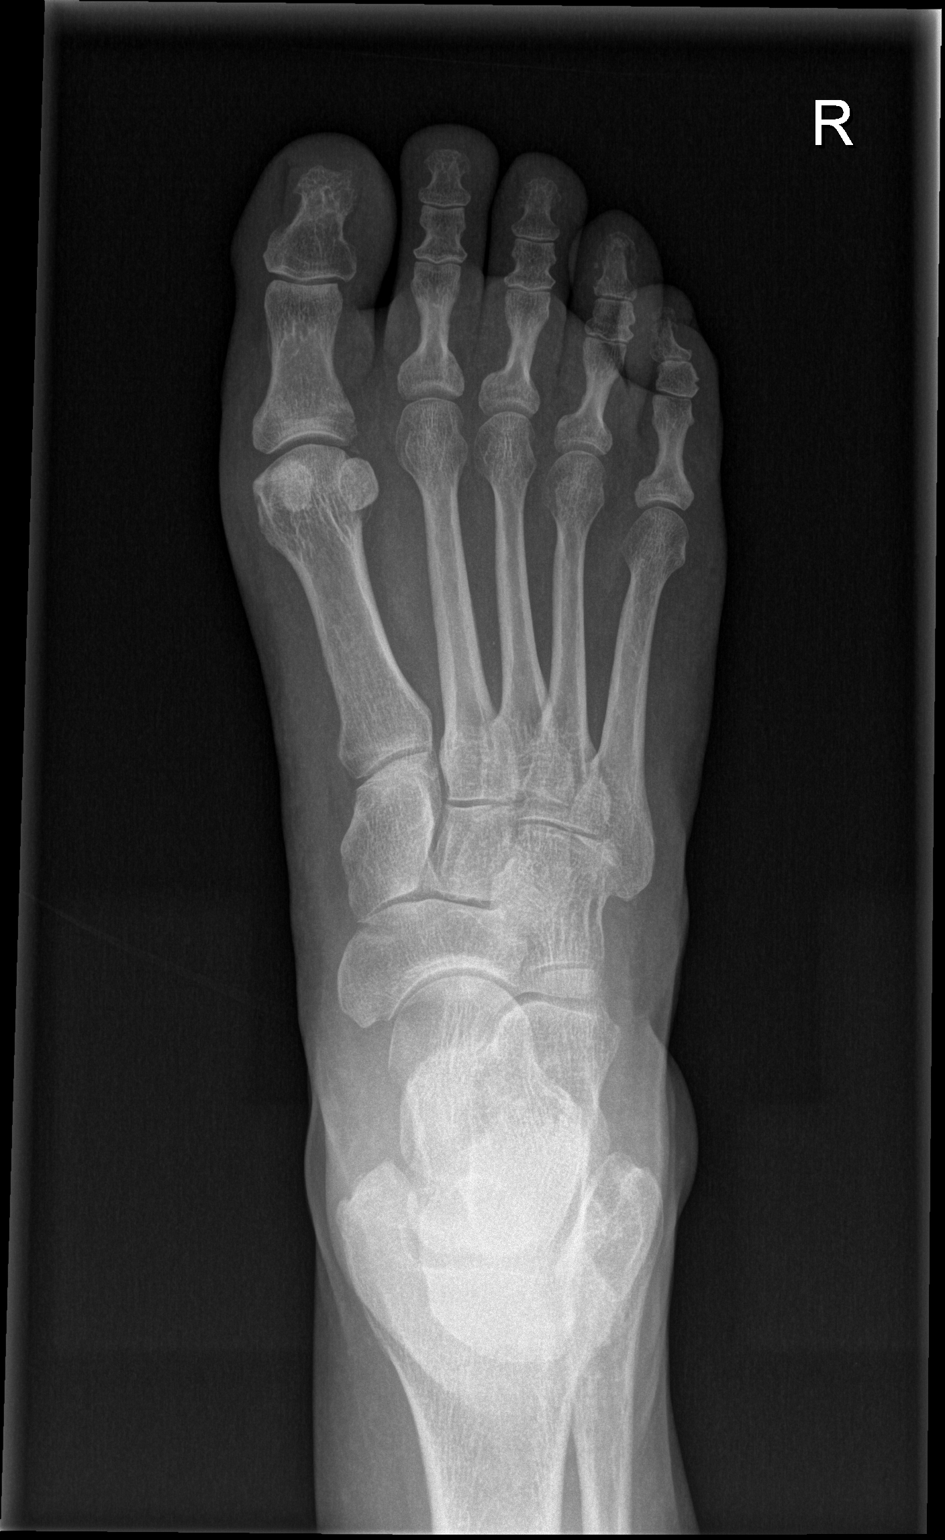

[x foot obl right]
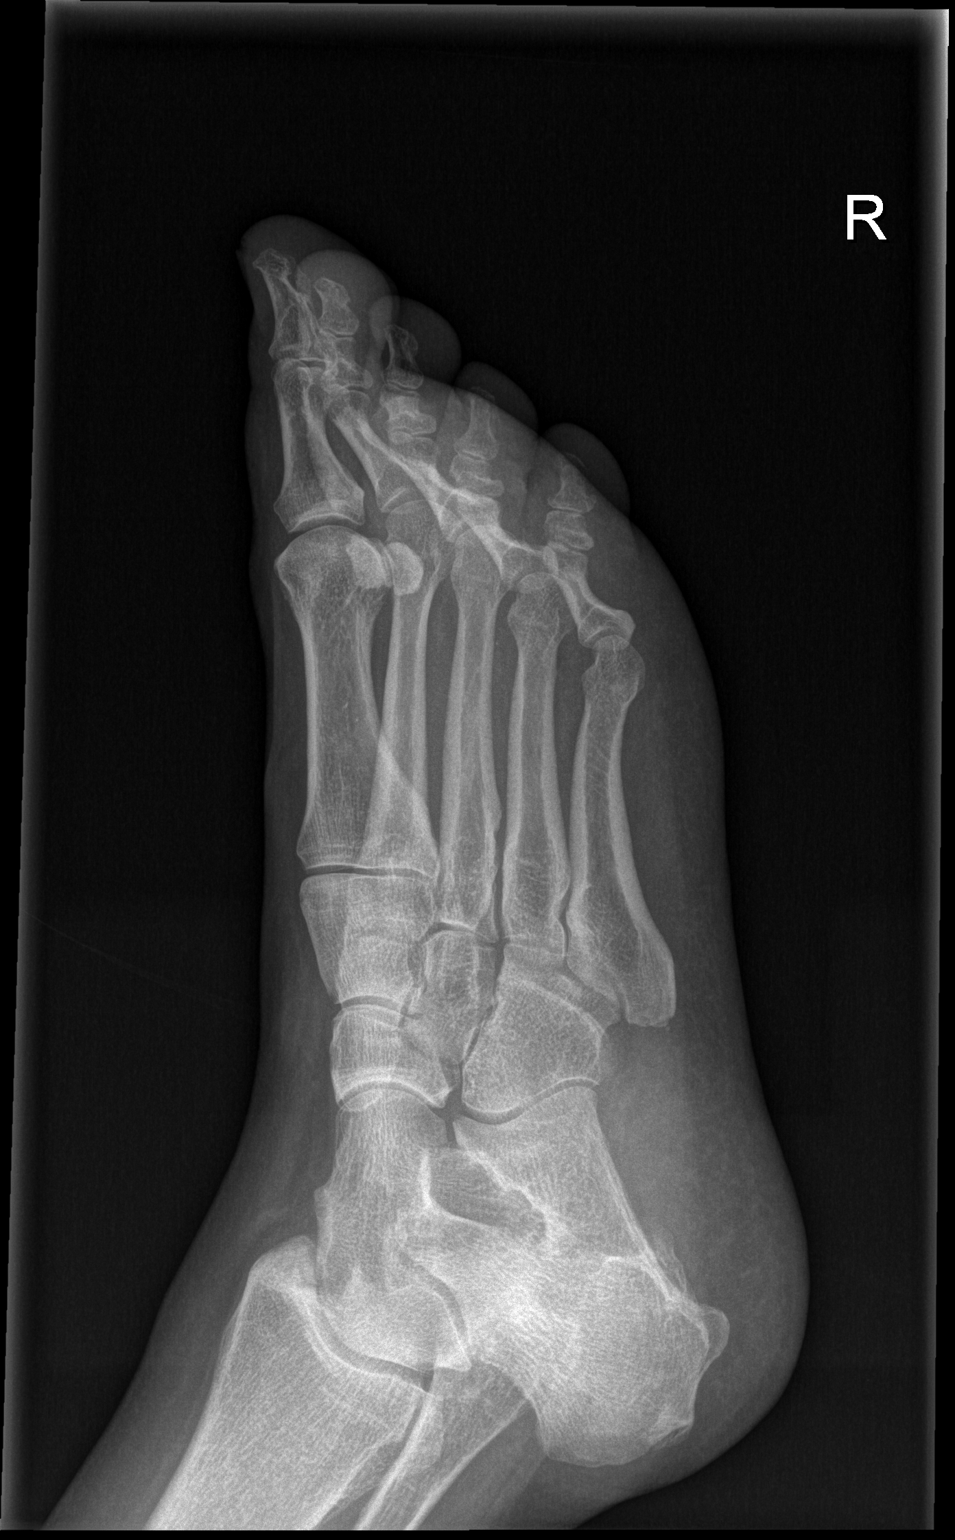

[x foot lat right]
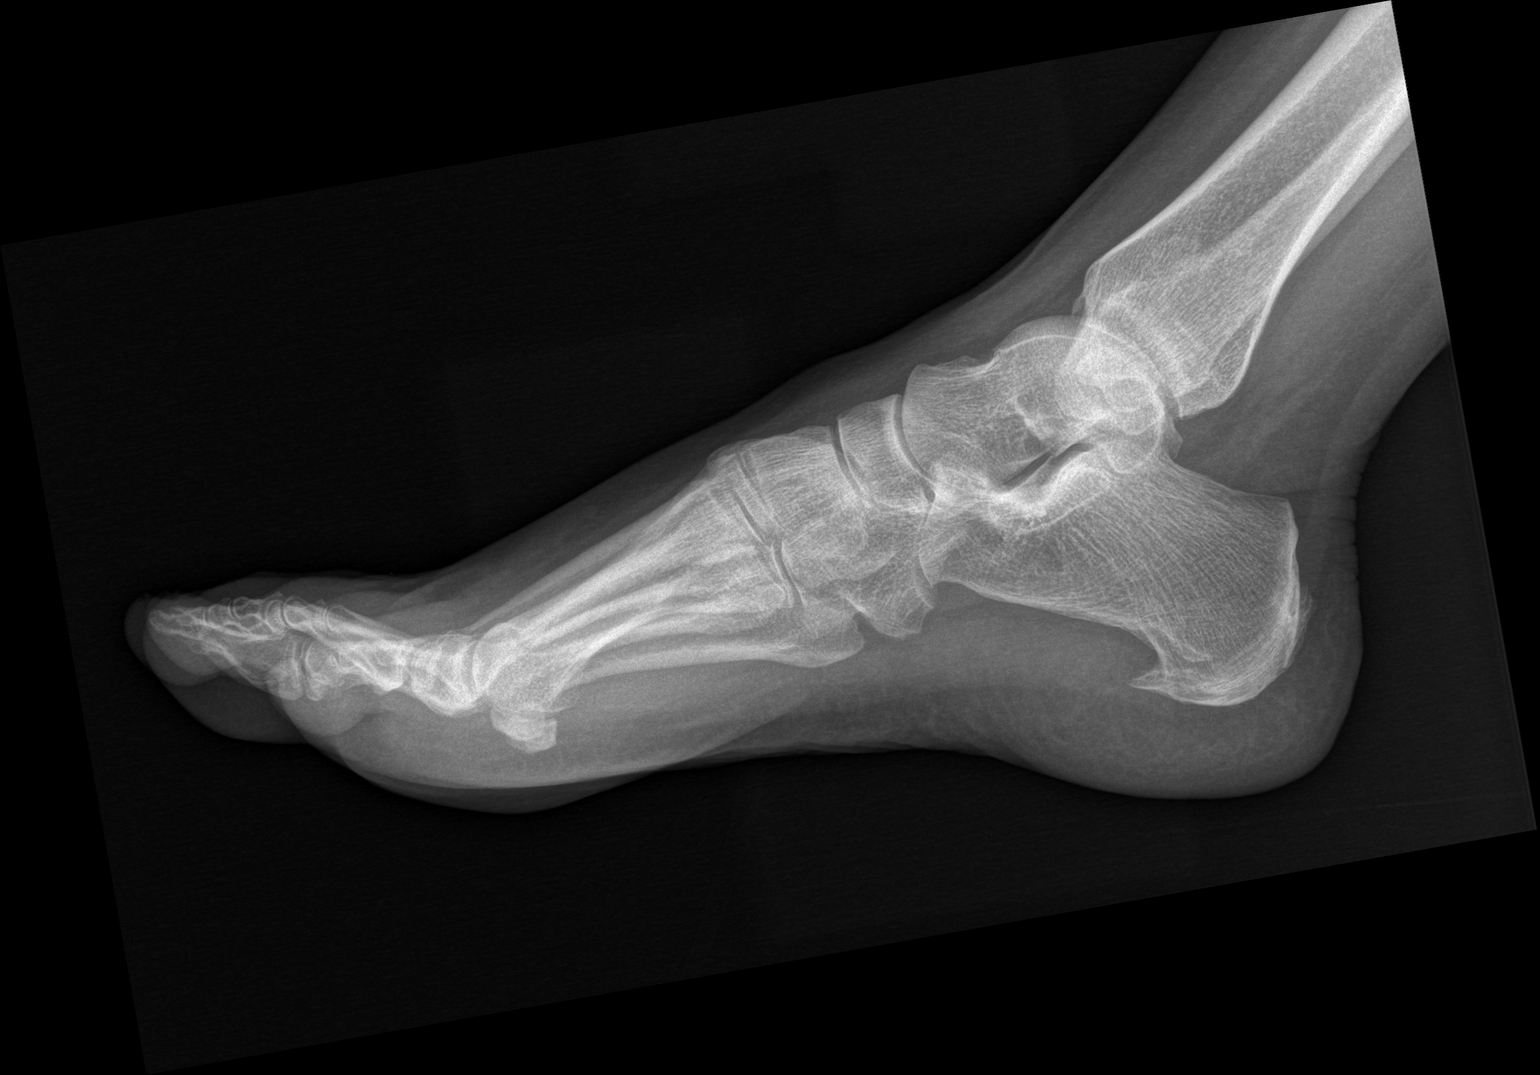

[3 of 3 positions shown; findings below may reference images not displayed]

FINDINGS: Fractures of the distal [REDACTED] of the fourth and fifth toes. No
involvement of the inter phalangeal joints. The other bones of the
foot appear unremarkable. Incidental plantar calcaneal spur.
IMPRESSION: Fractures of the distal [REDACTED] of the fourth and fifth toes.

## 2023-03-07 DIAGNOSIS — N904 Leukoplakia of vulva: Secondary | ICD-10-CM | POA: Insufficient documentation

## 2023-04-28 ENCOUNTER — Other Ambulatory Visit: Payer: Self-pay | Admitting: Obstetrics and Gynecology

## 2023-04-28 DIAGNOSIS — D259 Leiomyoma of uterus, unspecified: Secondary | ICD-10-CM

## 2023-06-08 ENCOUNTER — Ambulatory Visit
Admission: RE | Admit: 2023-06-08 | Discharge: 2023-06-08 | Disposition: A | Discharge status: Home / Self Care | Payer: No Typology Code available for payment source | Source: Ambulatory Visit | Attending: Obstetrics and Gynecology | Admitting: Obstetrics and Gynecology

## 2023-06-08 DIAGNOSIS — D259 Leiomyoma of uterus, unspecified: Secondary | ICD-10-CM

## 2023-06-08 MED ORDER — GADOPICLENOL 0.5 MMOL/ML IV SOLN
9.0000 mL | Freq: Once | INTRAVENOUS | Status: AC | PRN
  Administered 2023-06-08: 9 mL via INTRAVENOUS

## 2023-06-16 ENCOUNTER — Telehealth: Payer: Self-pay

## 2023-06-16 NOTE — Telephone Encounter (Signed)
Spoke with the patient regarding the referral to GYN oncology. Patient scheduled as new patient with Dr Pricilla Holm on 07/02/2023. Patient given an arrival time of 8:30am.  Explained to the patient the the doctor will perform a pelvic exam at this visit. Patient given the policy that only one visitor allowed and that visitor must be over 16 yrs are allowed in the Cancer Center. Patient given the address/phone number for the clinic and that the center offers free valet service. Patient aware that masks are option.

## 2023-06-29 ENCOUNTER — Encounter: Payer: Self-pay | Admitting: Gynecologic Oncology

## 2023-07-01 ENCOUNTER — Encounter: Payer: Self-pay | Admitting: Gynecologic Oncology

## 2023-07-01 ENCOUNTER — Inpatient Hospital Stay: Payer: No Typology Code available for payment source | Attending: Gynecologic Oncology

## 2023-07-01 DIAGNOSIS — Z803 Family history of malignant neoplasm of breast: Secondary | ICD-10-CM | POA: Insufficient documentation

## 2023-07-01 DIAGNOSIS — Z79890 Hormone replacement therapy (postmenopausal): Secondary | ICD-10-CM | POA: Insufficient documentation

## 2023-07-01 DIAGNOSIS — E039 Hypothyroidism, unspecified: Secondary | ICD-10-CM | POA: Insufficient documentation

## 2023-07-01 DIAGNOSIS — Z87410 Personal history of cervical dysplasia: Secondary | ICD-10-CM | POA: Insufficient documentation

## 2023-07-01 DIAGNOSIS — G8929 Other chronic pain: Secondary | ICD-10-CM | POA: Insufficient documentation

## 2023-07-01 DIAGNOSIS — Z809 Family history of malignant neoplasm, unspecified: Secondary | ICD-10-CM | POA: Insufficient documentation

## 2023-07-01 DIAGNOSIS — D398 Neoplasm of uncertain behavior of other specified female genital organs: Secondary | ICD-10-CM | POA: Insufficient documentation

## 2023-07-01 DIAGNOSIS — Z79899 Other long term (current) drug therapy: Secondary | ICD-10-CM | POA: Insufficient documentation

## 2023-07-01 DIAGNOSIS — N950 Postmenopausal bleeding: Secondary | ICD-10-CM | POA: Insufficient documentation

## 2023-07-01 DIAGNOSIS — Z87412 Personal history of vulvar dysplasia: Secondary | ICD-10-CM | POA: Insufficient documentation

## 2023-07-01 DIAGNOSIS — A630 Anogenital (venereal) warts: Secondary | ICD-10-CM | POA: Insufficient documentation

## 2023-07-01 DIAGNOSIS — M549 Dorsalgia, unspecified: Secondary | ICD-10-CM | POA: Insufficient documentation

## 2023-07-01 NOTE — Progress Notes (Signed)
GYNECOLOGIC ONCOLOGY NEW PATIENT CONSULTATION   Patient Name: Carolyn White  Patient Age: 57 y.o. Date of Service: 07/02/23 Referring Provider: Sherian Rein, MD  Primary Care Provider: [facility] Consulting Provider: Eugene Garnet, MD   Assessment/Plan:  Postmenopausal patient with lower uterine segment/upper cervical mass.  I reviewed recent MRI with the patient.  We looked at these pictures together.  She has an approximately 2.5 cm mass within the upper portion of her cervix that has some findings concerning for possible malignancy.  We also discussed benign conditions like a lower uterine segment or cervical fibroid that are also on the differential diagnosis.  Patient has a history of HPV related dysplasia.  Per her report, she underwent excisional procedure almost 10 years ago on her cervix.  I do not know that she has had close follow-up but denies any other abnormal Pap smear since then.  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  These findings indicate a history of HPV related disease which certainly puts her at risk for cervical dysplasia and malignancy.  Because of the location of this mass, I am unable to access it on exam today.  Her pelvic exam is essentially normal.  Her recent endometrial biopsy did not show any endometrial tissue but did have benign endocervical tissue.  Additional endocervical biopsy was deferred today given these results.  We discussed that the treatment for cervical cancer would be quite different than if this was a lower uterine segment or cervical fibroid.  It would be important for [place] to determine this prior to making any sort of surgical plan if at all possible.  Will sometimes a PET scan can be helpful in differentiating malignant from benign conditions, in the setting of a fibroid, PET scan may still appear positive and I do not know that it will add much  to the MRI.  Given its location, I think a transcervical biopsy would be possible.  This would best be done with interventional radiology likely under CT or ultrasound guidance.  Spoke with one of our interventional radiologist today and will place the order for this to be scheduled.  The patient also signed release of records so that we can get her prior Pap and LEEP history as well as her C-section reports.  Most likely, given my exam today, if biopsy shows an endocervical cancer, I think the patient will be a candidate for surgical treatment.  The route of surgery and type of surgery would be different if this is an endocervical cancer compared to a fibroid.  The patient will require a return visit to have further surgical counseling once we have a diagnosis.  In terms of her vulvar dysplasia history, discussed that repeat excision showed no residual dysplasia.  She will need continued close surveillance with her OB/GYN for this with visits every 6 months initially.  A copy of this note was sent to the patient's referring provider.   60 minutes of total time was spent for this patient encounter, including preparation, face-to-face counseling with the patient and coordination of care, and documentation of the encounter.  Eugene Garnet, MD  Division of Gynecologic Oncology  Department of Obstetrics and Gynecology  [facility]  ___________________________________________  Chief Complaint: Chief Complaint  Patient presents with   Cervical mass    History of Present Illness:  Carolyn White is a 57 y.o. y.o. female who is seen in consultation  at the request of Dr. Hinton Rao for an evaluation of a cervical/LUS mass.  The patient was recently seen by OB/GYN for vulvar lesion and postmenopausal bleeding.  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 This biopsy was performed at her new patient visit.  04/21/23: Returned for follow-up for larger vulvar excision and  endometrial biopsy in the setting of PMB. EMB -fragments of negative ecto and endocervical tissue, predominantly mucus.  No endometrial tissue identified.  Excision of vulvar lesion shows benign skin having reactive changes consistent with prior biopsy.  No residual dysplasia or malignancy.  Ultrasound from [facility] on 04/21/2023: There is measures 5.8 x 3.9 x 2.7 cm with an endometrial lining of 1.5 mm.  Bilateral ovaries normal in appearance.  There is a 2.4 cm heterogenous focal mass with increased vascularity in the lower uterine segment/cervix.  Possible subserosal fibroid versus cervical mass.  Pelvic MRI on 7/16 shows a 2 x 2.4 x 2.6 cm enhancing lesion along the right lateral aspect of the lower uterine segment/cervix.  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  Adjacent susceptibility artifact related to low anterior C-section.  No pelvic ascites.  Normal ovaries.  No evidence of metastatic disease.  The patient reports overall doing well.  Her history is notable for abnormal Pap smears with a LEEP performed in 2015 per her report.  She endorses having a Pap smear in 2023 or 2024 that was normal.  Her menses stopped in 2013 after her endometrial ablation.  She recently had a couple of days prior to seeing an OB/GYN earlier this year where she had some light spotting.  She denies any further bleeding.  She endorses some minimal pelvic pain intermittently over the last 6 months.  She reports regular bowel bladder function.  She endorses a good appetite without recent weight changes.  The patient has a history of chronic lower back pain.  She follows with pain management in [place].  She is scheduled to have a nerve ablation procedure at the end of the month.  PAST MEDICAL HISTORY:  Past Medical History:  Diagnosis Date   Back pain    Lichen sclerosus    Thyroid disease    Vulvar dysplasia      PAST SURGICAL HISTORY:  Past Surgical History:  Procedure Laterality Date    ABDOMINOPLASTY     CESAREAN SECTION  1998   and in 2002   ECTOPIC PREGNANCY SURGERY Right 1997   ENDOMETRIAL ABLATION  2013   MENISCUS REPAIR Left 2004   TUBAL LIGATION  2013    OB/GYN HISTORY:  OB History  Gravida Para Term Preterm AB Living  4 2     2 2   SAB IAB Ectopic Multiple Live Births  1   1        # Outcome Date GA Lbr Len/2nd Weight Sex Type Anes PTL Lv  4 Para           3 Para           2 SAB           1 Ectopic             Obstetric Comments  Twin SAB    No LMP recorded. Patient has had an ablation.  Age at menarche: 63  Age at menopause: ongoing symptoms, menses stopped at age 18 with endometrial ablation Hx of HRT: denies Hx of STDs: denies Last pap: 02/2022 - reported to be normal History of abnormal pap smears: denies  SCREENING STUDIES:  Last mammogram: 2023  Last colonoscopy: had never had  MEDICATIONS: Outpatient Encounter Medications as of 07/02/2023  Medication Sig   cyclobenzaprine (FLEXERIL) 10 MG tablet Take 1 tablet (10 mg total) by mouth 2 (two) times daily as needed for muscle spasms.   famotidine (PEPCID) 20 MG tablet Take 1 tablet (20 mg total) by mouth 2 (two) times daily.   levothyroxine (SYNTHROID) 88 MCG tablet Take 88 mcg by mouth daily before breakfast.   [DISCONTINUED] cephALEXin (KEFLEX) 500 MG capsule Take 1 capsule (500 mg total) by mouth 4 (four) times daily.   [DISCONTINUED] dicyclomine (BENTYL) 20 MG tablet Take 1 tablet (20 mg total) by mouth 2 (two) times daily. (Patient not taking: Reported on 06/29/2023)   [DISCONTINUED] ondansetron (ZOFRAN ODT) 4 MG disintegrating tablet Take 1 tablet (4 mg total) by mouth every 8 (eight) hours as needed for nausea or vomiting. (Patient not taking: Reported on 06/29/2023)   No facility-administered encounter medications on file as of 07/02/2023.    ALLERGIES:  Allergies  Allergen Reactions   Bactrim [Sulfamethoxazole-Trimethoprim] Rash    Possible reaction to bactrim: Hives & eye lid   swelling      FAMILY HISTORY:  Family History  Problem Relation Age of Onset   Irritable bowel syndrome Mother    Cancer Paternal Aunt    Breast cancer Paternal Aunt    Colon cancer Neg Hx    Ovarian cancer Neg Hx    Endometrial cancer Neg Hx    Pancreatic cancer Neg Hx    Prostate cancer Neg Hx      SOCIAL HISTORY:  Social Connections: Unknown (04/07/2022)   Received from [employer]   Social Network    Social Network: Not on file    REVIEW OF SYSTEMS:  Denies appetite changes, fevers, chills, fatigue, unexplained weight changes. Denies hearing loss, neck lumps or masses, mouth sores, ringing in ears or voice changes. Denies cough or wheezing.  Denies shortness of breath. Denies chest pain or palpitations. Denies leg swelling. Denies abdominal distention, pain, blood in stools, constipation, diarrhea, nausea, vomiting, or early satiety. Denies pain with intercourse, dysuria, frequency, hematuria or incontinence. Denies hot flashes, pelvic pain, vaginal bleeding or vaginal discharge.   Denies joint pain, back pain or muscle pain/cramps. Denies itching, rash, or wounds. Denies dizziness, headaches, numbness or seizures. Denies swollen lymph nodes or glands, denies easy bruising or bleeding. Denies anxiety, depression, confusion, or decreased concentration.  Physical Exam:  Vital Signs for this encounter:  Blood pressure (!) 142/70, pulse 71, temperature 98.8 F (37.1 C), temperature source Oral, resp. rate 16, height 5\' 6"  (1.676 m), weight 185 lb 6.4 oz (84.1 kg), SpO2 98%. Body mass index is 29.92 kg/m. General: Alert, oriented, no acute distress.  HEENT: Normocephalic, atraumatic. Sclera anicteric.  Chest: Clear to auscultation bilaterally. No wheezes, rhonchi, or rales. Cardiovascular: Regular rate and rhythm, no murmurs, rubs, or gallops.  Abdomen: Normoactive bowel sounds. Soft, nondistended, nontender to palpation. No masses or hepatosplenomegaly appreciated. No  palpable fluid wave.  Extremities: Grossly normal range of motion. Warm, well perfused. No edema bilaterally.  Skin: No rashes or lesions.  Lymphatics: No cervical, supraclavicular, or inguinal adenopathy.  GU:  Normal external female genitalia. No lesions. No discharge or bleeding.             Bladder/urethra:  No lesions or masses, well supported bladder             Vagina: No masses noted.  Cervix: Normal appearing, no lesions.  I am unable to palpate any mass or thickening posteriorly on rectovaginal exam.             Uterus: Small, mobile, no parametrial involvement or nodularity.             Adnexa: No masses.  Rectal: see above.  LABORATORY AND RADIOLOGIC DATA:  Outside medical records were reviewed to synthesize the above history, along with the history and physical obtained during the visit.

## 2023-07-01 NOTE — Progress Notes (Signed)
[facility] Clinical Social Work  Clinical Social Work was referred by medical provider for assessment of psychosocial needs due to score of 9 on depression screening.  Clinical Social Worker contacted patient by phone to offer support and assess for needs.  CSW explained purpose on call and completed safety assessment due to depression screening (No SI/HI). Patient attributes score to continued sporadic  feelings of grief related to husband's passing approx 3 years ago. Patient reported that she received grief support at the time of his passing and is continuing to be apart of support services through online platforms. Patient has not been diagnosed with a form of cancer therefore, CSW did brief introduction to support services offered. CSW inquired about feelings and thoughts about upcoming appointment.   No additional follow up needed at this time.  Marguerita Merles, LCSWA Clinical Social Worker [facility]

## 2023-07-02 ENCOUNTER — Other Ambulatory Visit: Payer: Self-pay

## 2023-07-02 ENCOUNTER — Encounter: Payer: Self-pay | Admitting: Gynecologic Oncology

## 2023-07-02 ENCOUNTER — Inpatient Hospital Stay: Payer: No Typology Code available for payment source | Admitting: Gynecologic Oncology

## 2023-07-02 ENCOUNTER — Telehealth: Payer: Self-pay | Admitting: Gynecologic Oncology

## 2023-07-02 VITALS — BP 142/70 | HR 71 | Temp 98.8°F | Resp 16 | Ht 66.0 in | Wt 185.4 lb

## 2023-07-02 DIAGNOSIS — E039 Hypothyroidism, unspecified: Secondary | ICD-10-CM | POA: Diagnosis not present

## 2023-07-02 DIAGNOSIS — G8929 Other chronic pain: Secondary | ICD-10-CM | POA: Diagnosis not present

## 2023-07-02 DIAGNOSIS — M545 Low back pain, unspecified: Secondary | ICD-10-CM | POA: Insufficient documentation

## 2023-07-02 DIAGNOSIS — N858 Other specified noninflammatory disorders of uterus: Secondary | ICD-10-CM | POA: Insufficient documentation

## 2023-07-02 DIAGNOSIS — Z809 Family history of malignant neoplasm, unspecified: Secondary | ICD-10-CM | POA: Diagnosis not present

## 2023-07-02 DIAGNOSIS — Z803 Family history of malignant neoplasm of breast: Secondary | ICD-10-CM | POA: Diagnosis not present

## 2023-07-02 DIAGNOSIS — N950 Postmenopausal bleeding: Secondary | ICD-10-CM

## 2023-07-02 DIAGNOSIS — M549 Dorsalgia, unspecified: Secondary | ICD-10-CM | POA: Diagnosis not present

## 2023-07-02 DIAGNOSIS — N888 Other specified noninflammatory disorders of cervix uteri: Secondary | ICD-10-CM | POA: Insufficient documentation

## 2023-07-02 DIAGNOSIS — Z79899 Other long term (current) drug therapy: Secondary | ICD-10-CM | POA: Diagnosis not present

## 2023-07-02 DIAGNOSIS — Z87410 Personal history of cervical dysplasia: Secondary | ICD-10-CM

## 2023-07-02 DIAGNOSIS — D398 Neoplasm of uncertain behavior of other specified female genital organs: Secondary | ICD-10-CM

## 2023-07-02 DIAGNOSIS — Z79890 Hormone replacement therapy (postmenopausal): Secondary | ICD-10-CM | POA: Diagnosis not present

## 2023-07-02 DIAGNOSIS — Z87412 Personal history of vulvar dysplasia: Secondary | ICD-10-CM

## 2023-07-02 DIAGNOSIS — Z8619 Personal history of other infectious and parasitic diseases: Secondary | ICD-10-CM

## 2023-07-02 DIAGNOSIS — A630 Anogenital (venereal) warts: Secondary | ICD-10-CM | POA: Diagnosis not present

## 2023-07-02 NOTE — Telephone Encounter (Signed)
Received medical records from the [place] that show patient had a Pap test in June 2022 that was negative for intraepithelial lesion.  I do not see that HPV testing was performed.  Clinical history states that the patient had a history of abnormal Pap with 2 prior biopsies.  There is no history noted of of a prior LEEP or excisional procedure.  Eugene Garnet MD Gynecologic Oncology

## 2023-07-02 NOTE — Patient Instructions (Signed)
It was nice to meet you today.  We discussed the mass in the upper part of your cervix and lower uterus.  There are some imaging characteristics that raise the concern for possible cancer although there are some benign diagnoses that could cause a similar appearing area.  Because this area is not visible on speculum exam, we discussed plan to biopsy it using imaging (either CT scan or ultrasound), biopsy through the cervix.  The office is working on getting your prior Pap test results as well as your C-section operative reports.

## 2023-07-06 ENCOUNTER — Encounter: Payer: Self-pay | Admitting: General Practice

## 2023-07-06 NOTE — Progress Notes (Signed)
Suttle, Thressa Sheller, MD  Oley Balm, MD; Caroleen Hamman, NT PROCEDURE / BIOPSY REVIEW Date: 07/06/23  Requested Biopsy site: Lower uterine segment mass Reason for request: per Dr. Pricilla Holm Imaging review: Best seen on MRI pelvis 06/08/23, series 21, image 9  Decision: Approved Imaging modality to perform: CT Schedule with: Moderate Sedation Schedule for: Any VIR, Dr Archer Asa, Dr Milford Cage, Dr Elby Showers, or Dr Fredia Sorrow  Additional comments: Dr. Carin Hock to perform bx in CT with [place] helping with imaging guidance.  Please contact me with questions, concerns, or if issue pertaining to this request arise.  Bennie Dallas, MD [facility] Radiology       Previous Messages    ----- Message ----- From: Oley Balm, MD Sent: 07/05/2023  10:41 AM EDT To: Bennie Dallas, MD Subject: FW: CT Biopsy                                   ----- Message ----- From: Caroleen Hamman, NT Sent: 07/05/2023  10:19 AM EDT To: Ir Procedure Requests Subject: CT Biopsy                                      Procedure: CT Biopsy  Reason: upper cervical/LUS mass Dx: Cervical mass  History: MR in chart  Provider: Carver Fila, MD  Contact: [phone]   MD note: Requesting CT or US guided biopsy of cervical mass. Discussed with Dr. Elby Showers. Will require coordination so that I can be there for the procedure.

## 2023-07-13 ENCOUNTER — Telehealth: Payer: Self-pay

## 2023-07-13 NOTE — Telephone Encounter (Signed)
Records request for c-section notes in 1998 & 2002, sent to [facility] center on 07/02/23 and resent on 07/05/23. I spoke to [place] [employer]) department today and she states the C-Section records are at an outside facility ([facility]) and has been requested which could take 5 business days.  Phone: [phone] Fax: [phone]

## 2023-07-14 ENCOUNTER — Other Ambulatory Visit: Payer: Self-pay | Admitting: Radiology

## 2023-07-15 ENCOUNTER — Other Ambulatory Visit: Payer: Self-pay

## 2023-07-15 ENCOUNTER — Encounter (HOSPITAL_COMMUNITY): Payer: Self-pay

## 2023-07-15 ENCOUNTER — Ambulatory Visit (HOSPITAL_COMMUNITY)
Admission: RE | Admit: 2023-07-15 | Discharge: 2023-07-15 | Disposition: A | Discharge status: Home / Self Care | Payer: No Typology Code available for payment source | Source: Ambulatory Visit | Attending: Gynecologic Oncology | Admitting: Gynecologic Oncology

## 2023-07-15 DIAGNOSIS — D390 Neoplasm of uncertain behavior of uterus: Secondary | ICD-10-CM

## 2023-07-15 DIAGNOSIS — N888 Other specified noninflammatory disorders of cervix uteri: Secondary | ICD-10-CM | POA: Insufficient documentation

## 2023-07-15 DIAGNOSIS — N939 Abnormal uterine and vaginal bleeding, unspecified: Secondary | ICD-10-CM | POA: Diagnosis not present

## 2023-07-15 LAB — CBC
HCT: 38.2 % (ref 36.0–46.0)
Hemoglobin: 12.4 g/dL (ref 12.0–15.0)
MCH: 28.9 pg (ref 26.0–34.0)
MCHC: 32.5 g/dL (ref 30.0–36.0)
MCV: 89 fL (ref 80.0–100.0)
Platelets: 284 10*3/uL (ref 150–400)
RBC: 4.29 MIL/uL (ref 3.87–5.11)
RDW: 12.4 % (ref 11.5–15.5)
WBC: 7.5 10*3/uL (ref 4.0–10.5)
nRBC: 0 % (ref 0.0–0.2)

## 2023-07-15 LAB — PROTIME-INR
INR: 1 (ref 0.8–1.2)
Prothrombin Time: 13.3 seconds (ref 11.4–15.2)

## 2023-07-15 MED ORDER — FENTANYL CITRATE (PF) 100 MCG/2ML IJ SOLN
INTRAMUSCULAR | Status: AC | PRN
  Administered 2023-07-15 (×2): 50 ug via INTRAVENOUS

## 2023-07-15 MED ORDER — MIDAZOLAM HCL 2 MG/2ML IJ SOLN
INTRAMUSCULAR | Status: AC | PRN
  Administered 2023-07-15: 1 mg via INTRAVENOUS

## 2023-07-15 MED ORDER — SODIUM CHLORIDE 0.9 % IV SOLN: INTRAVENOUS | Status: DC

## 2023-07-15 MED ORDER — MIDAZOLAM HCL 5 MG/5ML IJ SOLN
INTRAMUSCULAR | Status: AC | PRN
  Administered 2023-07-15: 1 mg via INTRAVENOUS

## 2023-07-15 MED ORDER — FENTANYL CITRATE (PF) 100 MCG/2ML IJ SOLN
INTRAMUSCULAR | Status: AC
  Filled 2023-07-15: qty 2

## 2023-07-15 MED ORDER — MIDAZOLAM HCL 2 MG/2ML IJ SOLN
INTRAMUSCULAR | Status: AC
  Filled 2023-07-15: qty 2

## 2023-07-15 MED ORDER — LIDOCAINE HCL (PF) 1 % IJ SOLN
10.0000 mL | Freq: Once | INTRAMUSCULAR | Status: AC
  Administered 2023-07-15: 10 mL
  Filled 2023-07-15: qty 10

## 2023-07-15 NOTE — Procedures (Signed)
Interventional Radiology Procedure Note  Procedure:   CT guided biopsy of uterine mass.   Guide needle was placed by Dr. Pricilla Holm, via speculum.  CT confirms location of guide needle, prior to Mx 18g core biopsy.   Assist:  Dr. Carin Hock  Complications: None EBL: None  Recommendations: - Bedrest 1 hours.  DC 1 hr - Routine wound care - Follow up pathology.  Dr. Pricilla Holm will place path order - Advance diet   Signed,  Yvone Neu. Loreta Ave, DO, ABVM, RPVI

## 2023-07-15 NOTE — Procedures (Signed)
Procedure note  Procedure was discussed with the patient in pre-op. Risks were reviewed including but not limited to bleeding, infection, and damage to surrounding structures. Patient signed consent.  Upper cervical/lower uterine segment biopsy procedure Preoperative diagnosis: LUS vs upper cervical mass Postoperative diagnosis: Same as above Physician: Pricilla Holm MD, Loreta Ave MD Assistant: Warner Mccreedy, NP Estimated blood loss: 25 cc Specimens: lower uterine segment/upper cervical biopsy  Procedure: After MAC administered and a time out performed, CT performed to visualize target mass. The patient placed in dorsolithotomy position and a speculum was placed in the vagina.  Once the cervix was well visualized it was cleansed with Betadine x3.  10 cc of 1% lidocaine was injected as a paracervical block. A single toothed tenaculum was then placed on the anterior lip of the cervix. The guide needle was then placed through the endocervix to a depth of approximately 3-4 cm, until more firm tissue felt. CT was repeat to confirm location of the needle within the mass. 3 18g core biopsies were then performed. This was placed in formalin.  Overall the patient tolerated the procedure well.  Pressure and Monsels were used to assure hemostasis. All instruments were removed from the vagina.   Eugene Garnet MD Gynecologic Oncology

## 2023-07-15 NOTE — Telephone Encounter (Signed)
Records received from [facility].  Dr.Tucker to review

## 2023-07-15 NOTE — H&P (Signed)
Chief Complaint: Patient was seen in consultation today for lower uterine segment mass biopsy at the request of Tucker,Katherine R  Referring Physician(s): Tucker,Katherine R  Supervising Physician: Gilmer Mor  Patient Status: [facility] - Out-pt  History of Present Illness: Carolyn White is a 57 y.o. female   FULL Code status per pt Hx abnormal paps---+HPV No abnormal paps in 10 yrs Noticed some vaginal bleeding and went to GYN  Dr Pricilla Holm note 07/02/23 She has an approximately 2.5 cm mass within the upper portion of her cervix that has some findings concerning for possible malignancy. We also discussed benign conditions like a lower uterine segment or cervical fibroid that are also on the differential diagnosis. Will sometimes a PET scan can be helpful in differentiating malignant from benign conditions, in the setting of a fibroid, PET scan may still appear positive and I do not know that it will add much to the MRI.  Given its location, I think a transcervical biopsy would be possible.  This would best be done with interventional radiology likely under CT or ultrasound guidance.  Spoke with one of our interventional radiologist today and will place the order for this to be scheduled.   Scheduled today for lower uterine segment mass biopsy    Past Medical History:  Diagnosis Date   Back pain    Lichen sclerosus    Thyroid disease    Vulvar dysplasia     Past Surgical History:  Procedure Laterality Date   ABDOMINOPLASTY     CESAREAN SECTION  1998   and in 2002   ECTOPIC PREGNANCY SURGERY Right 1997   ENDOMETRIAL ABLATION  2013   MENISCUS REPAIR Left 2004   TUBAL LIGATION  2013    Allergies: Bactrim [sulfamethoxazole-trimethoprim]  Medications: Prior to Admission medications   Medication Sig Start Date End Date Taking? Authorizing Provider  famotidine (PEPCID) 20 MG tablet Take 1 tablet (20 mg total) by mouth 2 (two) times daily. 01/07/15  Yes Pisciotta, Mardella Layman   levothyroxine (SYNTHROID) 88 MCG tablet Take 88 mcg by mouth daily before breakfast.   Yes [provider]  cyclobenzaprine (FLEXERIL) 10 MG tablet Take 1 tablet (10 mg total) by mouth 2 (two) times daily as needed for muscle spasms. 12/23/20   Carroll Sage, PA-C     Family History  Problem Relation Age of Onset   Irritable bowel syndrome Mother    Cancer Paternal Aunt    Breast cancer Paternal Aunt    Colon cancer Neg Hx    Ovarian cancer Neg Hx    Endometrial cancer Neg Hx    Pancreatic cancer Neg Hx    Prostate cancer Neg Hx     Social History   Socioeconomic History   Marital status: Widowed    Spouse name: Not on file   Number of children: Not on file   Years of education: Not on file   Highest education level: Not on file  Occupational History   Occupation: Dealer  Tobacco Use   Smoking status: Former    Types: Cigarettes   Smokeless tobacco: Not on file  Vaping Use   Vaping status: Former  Substance and Sexual Activity   Alcohol use: Not Currently   Drug use: Never   Sexual activity: Yes    Birth control/protection: None  Other Topics Concern   Not on file  Social History Narrative   Not on file   Social Determinants of Health   Financial Resource Strain: Not on  file  Food Insecurity: No Food Insecurity (06/29/2023)   Hunger Vital Sign    Worried About Running Out of Food in the Last Year: Never true    Ran Out of Food in the Last Year: Never true  Transportation Needs: No Transportation Needs (06/29/2023)   PRAPARE - Administrator, Civil Service (Medical): No    Lack of Transportation (Non-Medical): No  Physical Activity: Not on file  Stress: Not on file  Social Connections: Unknown (04/07/2022)   Received from [facility]   Social Network    Social Network: Not on file    Review of Systems: A 12 point ROS discussed and pertinent positives are indicated in the HPI above.  All other systems are  negative.  Review of Systems  Constitutional:  Negative for activity change, fatigue and fever.  Respiratory:  Negative for cough and shortness of breath.   Cardiovascular:  Negative for chest pain.  Gastrointestinal:  Negative for abdominal pain.  Neurological:  Negative for weakness.  Psychiatric/Behavioral:  Negative for behavioral problems and confusion.     Vital Signs: BP (!) 127/91   Pulse (!) 59   Temp 98.5 F (36.9 C) (Temporal)   Resp 16   Ht 5\' 6"  (1.676 m)   Wt 180 lb (81.6 kg)   SpO2 98%   BMI 29.05 kg/m     Physical Exam Vitals reviewed.  HENT:     Mouth/Throat:     Mouth: Mucous membranes are moist.  Cardiovascular:     Rate and Rhythm: Normal rate and regular rhythm.     Heart sounds: Normal heart sounds.  Pulmonary:     Effort: Pulmonary effort is normal.     Breath sounds: Normal breath sounds. No wheezing.  Abdominal:     Palpations: Abdomen is soft.  Musculoskeletal:        General: Normal range of motion.  Skin:    General: Skin is warm.  Neurological:     Mental Status: She is alert and oriented to person, place, and time.  Psychiatric:        Behavior: Behavior normal.     Imaging: No results found.  Labs:  CBC: No results for input(s): "WBC", "HGB", "HCT", "PLT" in the last 8760 hours.  COAGS: No results for input(s): "INR", "APTT" in the last 8760 hours.  BMP: No results for input(s): "NA", "K", "CL", "CO2", "GLUCOSE", "BUN", "CALCIUM", "CREATININE", "GFRNONAA", "GFRAA" in the last 8760 hours.  Invalid input(s): "CMP"  LIVER FUNCTION TESTS: No results for input(s): "BILITOT", "AST", "ALT", "ALKPHOS", "PROT", "ALBUMIN" in the last 8760 hours.  TUMOR MARKERS: No results for input(s): "AFPTM", "CEA", "CA199", "CHROMGRNA" in the last 8760 hours.  Assessment and Plan:  Scheduled for lower uterine segment mass biopsy in IR today Risks and benefits of lower uterine segment mass biopsy was discussed with the patient and/or  patient's family including, but not limited to bleeding, infection, damage to adjacent structures or low yield requiring additional tests.  All of the questions were answered and there is agreement to proceed.  Consent signed and in chart.  Thank you for this interesting consult.  I greatly enjoyed meeting Carolyn White and look forward to participating in their care.  A copy of this report was sent to the requesting provider on this date.  Electronically Signed: Robet Leu, PA-C 07/15/2023, 7:28 AM   I spent a total of  30 Minutes   in face to face in clinical consultation, greater than  50% of which was counseling/coordinating care for lower uterine segment mass biopsy

## 2023-07-16 ENCOUNTER — Telehealth: Payer: Self-pay

## 2023-07-16 LAB — SURGICAL PATHOLOGY

## 2023-07-16 NOTE — Telephone Encounter (Signed)
Follow up call  procedure from yesterday. She states she is doing ok. The bleeding stopped late last night. It took awhile to get over the medication that was given to her, but doing fine now. No fever/chills, no N/V. No pain at this time.   Per [facility] APP, pt scheduled for a 1 week phone visit with Dr. Pricilla Holm.

## 2023-07-22 ENCOUNTER — Inpatient Hospital Stay: Payer: No Typology Code available for payment source | Admitting: Gynecologic Oncology

## 2023-07-23 ENCOUNTER — Other Ambulatory Visit: Payer: Self-pay | Admitting: Gynecologic Oncology

## 2023-07-23 DIAGNOSIS — N888 Other specified noninflammatory disorders of cervix uteri: Secondary | ICD-10-CM

## 2023-08-04 NOTE — Progress Notes (Signed)
COVID Vaccine Completed:no  Date of COVID positive in last 90 days: no  [facility]- Dr. Willa Rough Cardiologist - n/a  Chest x-ray - n/a EKG - n/a Stress Test - 8 years ago per pt ECHO - n/a Cardiac Cath - n/a Pacemaker/ICD device last checked: n/a Spinal Cord Stimulator: n/a  Bowel Prep - light diet  the day before  Sleep Study - n/a CPAP -   Fasting Blood Sugar - n/a Checks Blood Sugar _____ times a day  Last dose of GLP1 agonist-  N/A GLP1 instructions:  N/A   Last dose of SGLT-2 inhibitors-  N/A SGLT-2 instructions: N/A   Blood Thinner Instructions: n/a Aspirin Instructions: Last Dose:  Activity level: Can go up a flight of stairs and perform activities of daily living without stopping and without symptoms of chest pain or shortness of breath.    Anesthesia review:   Patient denies shortness of breath, fever, cough and chest pain at PAT appointment  Patient verbalized understanding of instructions that were given to them at the PAT appointment. Patient was also instructed that they will need to review over the PAT instructions again at home before surgery.

## 2023-08-04 NOTE — Patient Instructions (Signed)
SURGICAL WAITING ROOM VISITATION  Patients having surgery or a procedure may have no more than 2 support people in the waiting area - these visitors may rotate.    Children under the age of 75 must have an adult with them who is not the patient.  Due to an increase in RSV and influenza rates and associated hospitalizations, children ages 85 and under may not visit patients in [facility] hospitals.  If the patient needs to stay at the hospital during part of their recovery, the visitor guidelines for inpatient rooms apply. Pre-op nurse will coordinate an appropriate time for 1 support person to accompany patient in pre-op.  This support person may not rotate.    Please refer to the [facility] website for the visitor guidelines for Inpatients (after your surgery is over and you are in a regular room).    Your procedure is scheduled on: 08/11/23   Report to [facility] Main Entrance    Report to admitting at 6:15 AM   Call this number if you have problems the morning of surgery [phone]   Do not eat food :After Midnight.   After Midnight you may have the following liquids until 5:30 AM DAY OF SURGERY  Water Non-Citrus Juices (without pulp, NO RED-Apple, White grape, White cranberry) Black Coffee (NO MILK/CREAM OR CREAMERS, sugar ok)  Clear Tea (NO MILK/CREAM OR CREAMERS, sugar ok) regular and decaf                             Plain Jell-O (NO RED)                                           Fruit ices (not with fruit pulp, NO RED)                                     Popsicles (NO RED)                                                               Sports drinks like Gatorade (NO RED)          If you have questions, please contact your surgeon's office.   FOLLOW BOWEL PREP AND ANY ADDITIONAL PRE OP INSTRUCTIONS YOU RECEIVED FROM YOUR SURGEON'S OFFICE!!!     Oral Hygiene is also important to reduce your risk of infection.                                    Remember -  BRUSH YOUR TEETH THE MORNING OF SURGERY WITH YOUR REGULAR TOOTHPASTE  DENTURES WILL BE REMOVED PRIOR TO SURGERY PLEASE DO NOT APPLY "Poly grip" OR ADHESIVES!!!   Stop all vitamins and herbal supplements 7 days before surgery.   Take these medicines the morning of surgery with A SIP OF WATER: Lexapro, Levothyroxine  You may not have any metal on your body including hair pins, jewelry, and body piercing             Do not wear make-up, lotions, powders, perfumes, or deodorant  Do not wear nail polish including gel and S&S, artificial/acrylic nails, or any other type of covering on natural nails including finger and toenails. If you have artificial nails, gel coating, etc. that needs to be removed by a nail salon please have this removed prior to surgery or surgery may need to be canceled/ delayed if the surgeon/ anesthesia feels like they are unable to be safely monitored.   Do not shave  48 hours prior to surgery.    Do not bring valuables to the hospital. [facility] IS NOT             RESPONSIBLE   FOR VALUABLES.   Contacts, glasses, dentures or bridgework may not be worn into surgery.  DO NOT BRING YOUR HOME MEDICATIONS TO THE HOSPITAL. PHARMACY WILL DISPENSE MEDICATIONS LISTED ON YOUR MEDICATION LIST TO YOU DURING YOUR ADMISSION IN THE HOSPITAL!    Patients discharged on the day of surgery will not be allowed to drive home.  Someone NEEDS to stay with you for the first 24 hours after anesthesia.   Special Instructions: Bring a copy of your healthcare power of attorney and living will documents the day of surgery if you haven't scanned them before.              Please read over the following fact sheets you were given: IF YOU HAVE QUESTIONS ABOUT YOUR PRE-OP INSTRUCTIONS PLEASE CALL [name]   If you received a COVID test during your pre-op visit  it is requested that you wear a mask when out in public, stay away from anyone that may not be  feeling well and notify your surgeon if you develop symptoms. If you test positive for Covid or have been in contact with anyone that has tested positive in the last 10 days please notify you surgeon.    [facility] - Preparing for Surgery Before surgery, you can play an important role.  Because skin is not sterile, your skin needs to be as free of germs as possible.  You can reduce the number of germs on your skin by washing with CHG (chlorahexidine gluconate) soap before surgery.  CHG is an antiseptic cleaner which kills germs and bonds with the skin to continue killing germs even after washing. Please DO NOT use if you have an allergy to CHG or antibacterial soaps.  If your skin becomes reddened/irritated stop using the CHG and inform your nurse when you arrive at Short Stay. Do not shave (including legs and underarms) for at least 48 hours prior to the first CHG shower.  You may shave your face/neck.  Please follow these instructions carefully:  1.  Shower with CHG Soap the night before surgery and the  morning of surgery.  2.  If you choose to wash your hair, wash your hair first as usual with your normal  shampoo.  3.  After you shampoo, rinse your hair and body thoroughly to remove the shampoo.                             4.  Use CHG as you would any other liquid soap.  You can apply chg directly to the skin and wash.  Gently with a scrungie  or clean washcloth.  5.  Apply the CHG Soap to your body ONLY FROM THE NECK DOWN.   Do   not use on face/ open                           Wound or open sores. Avoid contact with eyes, ears mouth and   genitals (private parts).                       Wash face,  Genitals (private parts) with your normal soap.             6.  Wash thoroughly, paying special attention to the area where your    surgery  will be performed.  7.  Thoroughly rinse your body with warm water from the neck down.  8.  DO NOT shower/wash with your normal soap after using and rinsing off  the CHG Soap.                9.  Pat yourself dry with a clean towel.            10.  Wear clean pajamas.            11.  Place clean sheets on your bed the night of your first shower and do not  sleep with pets. Day of Surgery : Do not apply any lotions/deodorants the morning of surgery.  Please wear clean clothes to the hospital/surgery center.  FAILURE TO FOLLOW THESE INSTRUCTIONS MAY RESULT IN THE CANCELLATION OF YOUR SURGERY  PATIENT SIGNATURE_________________________________  NURSE SIGNATURE__________________________________  ________________________________________________________________________ WHAT IS A BLOOD TRANSFUSION? Blood Transfusion Information  A transfusion is the replacement of blood or some of its parts. Blood is made up of multiple cells which provide different functions. Red blood cells carry oxygen and are used for blood loss replacement. White blood cells fight against infection. Platelets control bleeding. Plasma helps clot blood. Other blood products are available for specialized needs, such as hemophilia or other clotting disorders. BEFORE THE TRANSFUSION  Who gives blood for transfusions?  Healthy volunteers who are fully evaluated to make sure their blood is safe. This is blood bank blood. Transfusion therapy is the safest it has ever been in the practice of medicine. Before blood is taken from a donor, a complete history is taken to make sure that person has no history of diseases nor engages in risky social behavior (examples are intravenous drug use or sexual activity with multiple partners). The donor's travel history is screened to minimize risk of transmitting infections, such as malaria. The donated blood is tested for signs of infectious diseases, such as HIV and hepatitis. The blood is then tested to be sure it is compatible with you in order to minimize the chance of a transfusion reaction. If you or a relative donates blood, this is often done in  anticipation of surgery and is not appropriate for emergency situations. It takes many days to process the donated blood. RISKS AND COMPLICATIONS Although transfusion therapy is very safe and saves many lives, the main dangers of transfusion include:  Getting an infectious disease. Developing a transfusion reaction. This is an allergic reaction to something in the blood you were given. Every precaution is taken to prevent this. The decision to have a blood transfusion has been considered carefully by your caregiver before blood is given. Blood is not given unless the benefits outweigh the risks. AFTER THE TRANSFUSION  Right after receiving a blood transfusion, you will usually feel much better and more energetic. This is especially true if your red blood cells have gotten low (anemic). The transfusion raises the level of the red blood cells which carry oxygen, and this usually causes an energy increase. The nurse administering the transfusion will monitor you carefully for complications. HOME CARE INSTRUCTIONS  No special instructions are needed after a transfusion. You may find your energy is better. Speak with your caregiver about any limitations on activity for underlying diseases you may have. SEEK MEDICAL CARE IF:  Your condition is not improving after your transfusion. You develop redness or irritation at the intravenous (IV) site. SEEK IMMEDIATE MEDICAL CARE IF:  Any of the following symptoms occur over the next 12 hours: Shaking chills. You have a temperature by mouth above 102 F (38.9 C), not controlled by medicine. Chest, back, or muscle pain. People around you feel you are not acting correctly or are confused. Shortness of breath or difficulty breathing. Dizziness and fainting. You get a rash or develop hives. You have a decrease in urine output. Your urine turns a dark color or changes to pink, red, or brown. Any of the following symptoms occur over the next 10 days: You have a  temperature by mouth above 102 F (38.9 C), not controlled by medicine. Shortness of breath. Weakness after normal activity. The White part of the eye turns yellow (jaundice). You have a decrease in the amount of urine or are urinating less often. Your urine turns a dark color or changes to pink, red, or brown. Document Released: 11/06/2000 Document Revised: 02/01/2012 Document Reviewed: 06/25/2008 [facility] Patient Information 2014 [place], [place].  _______________________________________________________________________

## 2023-08-05 ENCOUNTER — Encounter (HOSPITAL_COMMUNITY)
Admission: RE | Admit: 2023-08-05 | Discharge: 2023-08-05 | Disposition: A | Discharge status: Home / Self Care | Payer: No Typology Code available for payment source | Source: Ambulatory Visit | Attending: Gynecologic Oncology | Admitting: Gynecologic Oncology

## 2023-08-05 ENCOUNTER — Other Ambulatory Visit: Payer: Self-pay

## 2023-08-05 ENCOUNTER — Encounter (HOSPITAL_COMMUNITY): Payer: Self-pay

## 2023-08-05 DIAGNOSIS — Z01812 Encounter for preprocedural laboratory examination: Secondary | ICD-10-CM | POA: Insufficient documentation

## 2023-08-05 DIAGNOSIS — N888 Other specified noninflammatory disorders of cervix uteri: Secondary | ICD-10-CM | POA: Diagnosis not present

## 2023-08-05 HISTORY — DX: Depression, unspecified: F32.A

## 2023-08-05 HISTORY — DX: Unspecified osteoarthritis, unspecified site: M19.90

## 2023-08-05 HISTORY — DX: Pneumonia, unspecified organism: J18.9

## 2023-08-05 HISTORY — DX: Gastro-esophageal reflux disease without esophagitis: K21.9

## 2023-08-05 LAB — CBC
HCT: 39.1 % (ref 36.0–46.0)
Hemoglobin: 12.9 g/dL (ref 12.0–15.0)
MCH: 30 pg (ref 26.0–34.0)
MCHC: 33 g/dL (ref 30.0–36.0)
MCV: 90.9 fL (ref 80.0–100.0)
Platelets: 303 10*3/uL (ref 150–400)
RBC: 4.3 MIL/uL (ref 3.87–5.11)
RDW: 12.7 % (ref 11.5–15.5)
WBC: 8.7 10*3/uL (ref 4.0–10.5)
nRBC: 0 % (ref 0.0–0.2)

## 2023-08-05 LAB — COMPREHENSIVE METABOLIC PANEL
ALT: 19 U/L (ref 0–44)
AST: 20 U/L (ref 15–41)
Albumin: 4.4 g/dL (ref 3.5–5.0)
Alkaline Phosphatase: 70 U/L (ref 38–126)
Anion gap: 8 (ref 5–15)
BUN: 11 mg/dL (ref 6–20)
CO2: 26 mmol/L (ref 22–32)
Calcium: 9.5 mg/dL (ref 8.9–10.3)
Chloride: 106 mmol/L (ref 98–111)
Creatinine, Ser: 0.88 mg/dL (ref 0.44–1.00)
GFR, Estimated: >60 mL/min (ref >60)
Glucose, Bld: 92 mg/dL (ref 70–99)
Potassium: 3.9 mmol/L (ref 3.5–5.1)
Sodium: 140 mmol/L (ref 135–145)
Total Bilirubin: 0.6 mg/dL (ref 0.3–1.2)
Total Protein: 7.4 g/dL (ref 6.5–8.1)

## 2023-08-10 ENCOUNTER — Other Ambulatory Visit: Payer: Self-pay | Admitting: Gynecologic Oncology

## 2023-08-10 ENCOUNTER — Telehealth: Payer: Self-pay | Admitting: *Deleted

## 2023-08-10 ENCOUNTER — Encounter: Payer: Self-pay | Admitting: Gynecologic Oncology

## 2023-08-10 DIAGNOSIS — N888 Other specified noninflammatory disorders of cervix uteri: Secondary | ICD-10-CM

## 2023-08-10 MED ORDER — SENNOSIDES-DOCUSATE SODIUM 8.6-50 MG PO TABS: 2.0000 | ORAL_TABLET | Freq: Every day | ORAL | 0 refills | Status: DC

## 2023-08-10 MED ORDER — TRAMADOL HCL 50 MG PO TABS
50.0000 mg | ORAL_TABLET | Freq: Four times a day (QID) | ORAL | 0 refills | Status: DC | PRN
Start: 2023-08-10 — End: 2023-08-10

## 2023-08-10 MED ORDER — TRAMADOL HCL 50 MG PO TABS
50.0000 mg | ORAL_TABLET | Freq: Four times a day (QID) | ORAL | 0 refills | Status: DC | PRN
Start: 2023-08-10 — End: 2023-09-07

## 2023-08-10 MED ORDER — SENNOSIDES-DOCUSATE SODIUM 8.6-50 MG PO TABS
2.0000 | ORAL_TABLET | Freq: Every day | ORAL | 0 refills | Status: DC
Start: 2023-08-10 — End: 2023-08-10

## 2023-08-10 NOTE — Progress Notes (Signed)
Meds resent to [facility] since pt called and power is out at [facility].

## 2023-08-10 NOTE — Anesthesia Preprocedure Evaluation (Signed)
Anesthesia Evaluation  Patient identified by MRN, date of birth, ID band Patient awake    Reviewed: Allergy & Precautions, H&P , NPO status , Patient's Chart, lab work & pertinent test results  Airway Mallampati: II  TM Distance: >3 FB Neck ROM: Full    Dental no notable dental hx. (+) Teeth Intact, Dental Advisory Given   Pulmonary pneumonia, former smoker   Pulmonary exam normal breath sounds clear to auscultation       Cardiovascular Exercise Tolerance: Good negative cardio ROS Normal cardiovascular exam Rhythm:Regular Rate:Normal     Neuro/Psych  PSYCHIATRIC DISORDERS Anxiety Depression    negative neurological ROS     GI/Hepatic Neg liver ROS,GERD  Medicated and Controlled,,  Endo/Other  negative endocrine ROSHypothyroidism    Renal/GU negative Renal ROS  negative genitourinary   Musculoskeletal  (+) Arthritis ,    Abdominal   Peds negative pediatric ROS (+)  Hematology negative hematology ROS (+)   Anesthesia Other Findings   Reproductive/Obstetrics negative OB ROS                             Anesthesia Physical Anesthesia Plan  ASA: 2  Anesthesia Plan: General   Post-op Pain Management: Minimal or no pain anticipated, Tylenol PO (pre-op)*, Celebrex PO (pre-op)* and Dilaudid IV   Induction: Intravenous  PONV Risk Score and Plan: 3 and Ondansetron, Dexamethasone and Treatment may vary due to age or medical condition  Airway Management Planned: Oral ETT  Additional Equipment: None  Intra-op Plan:   Post-operative Plan: Extubation in OR  Informed Consent: I have reviewed the patients History and Physical, chart, labs and discussed the procedure including the risks, benefits and alternatives for the proposed anesthesia with the patient or authorized representative who has indicated his/her understanding and acceptance.       Plan Discussed with: Anesthesiologist and  CRNA  Anesthesia Plan Comments: (  )       Anesthesia Quick Evaluation

## 2023-08-10 NOTE — Telephone Encounter (Signed)
Telephone call to check on pre-operative status.  Patient compliant with pre-operative instructions.  Reinforced nothing to eat after midnight. Clear liquids until 0515. Patient to arrive at 0615.  No questions or concerns voiced.  Instructed to call for any needs.

## 2023-08-11 ENCOUNTER — Ambulatory Visit (HOSPITAL_COMMUNITY)
Admission: RE | Admit: 2023-08-11 | Discharge: 2023-08-11 | Disposition: A | Discharge status: Home / Self Care | Payer: No Typology Code available for payment source | Source: Ambulatory Visit | Attending: Gynecologic Oncology | Admitting: Gynecologic Oncology

## 2023-08-11 ENCOUNTER — Encounter (HOSPITAL_COMMUNITY)
Admission: RE | Disposition: A | Discharge status: Home / Self Care | Payer: Self-pay | Source: Ambulatory Visit | Attending: Gynecologic Oncology

## 2023-08-11 ENCOUNTER — Ambulatory Visit (HOSPITAL_BASED_OUTPATIENT_CLINIC_OR_DEPARTMENT_OTHER): Payer: No Typology Code available for payment source | Admitting: Anesthesiology

## 2023-08-11 ENCOUNTER — Ambulatory Visit (HOSPITAL_COMMUNITY): Payer: No Typology Code available for payment source | Admitting: Anesthesiology

## 2023-08-11 ENCOUNTER — Other Ambulatory Visit: Payer: Self-pay

## 2023-08-11 ENCOUNTER — Encounter (HOSPITAL_COMMUNITY): Payer: Self-pay | Admitting: Gynecologic Oncology

## 2023-08-11 DIAGNOSIS — D259 Leiomyoma of uterus, unspecified: Secondary | ICD-10-CM

## 2023-08-11 DIAGNOSIS — F419 Anxiety disorder, unspecified: Secondary | ICD-10-CM | POA: Insufficient documentation

## 2023-08-11 DIAGNOSIS — N83201 Unspecified ovarian cyst, right side: Secondary | ICD-10-CM | POA: Diagnosis not present

## 2023-08-11 DIAGNOSIS — N888 Other specified noninflammatory disorders of cervix uteri: Secondary | ICD-10-CM | POA: Diagnosis present

## 2023-08-11 DIAGNOSIS — F32A Depression, unspecified: Secondary | ICD-10-CM | POA: Diagnosis not present

## 2023-08-11 DIAGNOSIS — N736 Female pelvic peritoneal adhesions (postinfective): Secondary | ICD-10-CM

## 2023-08-11 DIAGNOSIS — N858 Other specified noninflammatory disorders of uterus: Secondary | ICD-10-CM | POA: Diagnosis not present

## 2023-08-11 DIAGNOSIS — M1990 Unspecified osteoarthritis, unspecified site: Secondary | ICD-10-CM | POA: Diagnosis not present

## 2023-08-11 DIAGNOSIS — K219 Gastro-esophageal reflux disease without esophagitis: Secondary | ICD-10-CM | POA: Insufficient documentation

## 2023-08-11 DIAGNOSIS — N8000 Endometriosis of the uterus, unspecified: Secondary | ICD-10-CM | POA: Diagnosis not present

## 2023-08-11 DIAGNOSIS — Z87891 Personal history of nicotine dependence: Secondary | ICD-10-CM | POA: Diagnosis not present

## 2023-08-11 DIAGNOSIS — N8003 Adenomyosis of the uterus: Secondary | ICD-10-CM | POA: Insufficient documentation

## 2023-08-11 HISTORY — PX: ROBOTIC ASSISTED TOTAL HYSTERECTOMY WITH BILATERAL SALPINGO OOPHERECTOMY: SHX6086

## 2023-08-11 LAB — TYPE AND SCREEN
ABO/RH(D): O POS
Antibody Screen: NEGATIVE

## 2023-08-11 SURGERY — HYSTERECTOMY, TOTAL, ROBOT-ASSISTED, LAPAROSCOPIC, WITH BILATERAL SALPINGO-OOPHORECTOMY
Anesthesia: General | Laterality: Bilateral

## 2023-08-11 MED ORDER — OXYCODONE HCL 5 MG/5ML PO SOLN
ORAL | Status: AC
  Filled 2023-08-11: qty 5

## 2023-08-11 MED ORDER — CHLORHEXIDINE GLUCONATE 0.12 % MT SOLN: 15.0000 mL | Freq: Once | OROMUCOSAL | Status: DC

## 2023-08-11 MED ORDER — EPHEDRINE SULFATE (PRESSORS) 50 MG/ML IJ SOLN
INTRAMUSCULAR | Status: DC | PRN
Start: 2023-08-11 — End: 2023-08-11
  Administered 2023-08-11: 10 mg via INTRAVENOUS
  Administered 2023-08-11: 20 mg via INTRAVENOUS

## 2023-08-11 MED ORDER — DEXMEDETOMIDINE HCL IN NACL 80 MCG/20ML IV SOLN
INTRAVENOUS | Status: DC | PRN
Start: 2023-08-11 — End: 2023-08-11
  Administered 2023-08-11: 6 ug via INTRAVENOUS

## 2023-08-11 MED ORDER — BUPIVACAINE HCL 0.25 % IJ SOLN
INTRAMUSCULAR | Status: AC
  Filled 2023-08-11: qty 1

## 2023-08-11 MED ORDER — FENTANYL CITRATE (PF) 100 MCG/2ML IJ SOLN
INTRAMUSCULAR | Status: AC
  Filled 2023-08-11: qty 2

## 2023-08-11 MED ORDER — HYDROMORPHONE HCL 1 MG/ML IJ SOLN
INTRAMUSCULAR | Status: AC
  Filled 2023-08-11: qty 1

## 2023-08-11 MED ORDER — LACTATED RINGERS IV SOLN: INTRAVENOUS | Status: DC | PRN

## 2023-08-11 MED ORDER — GABAPENTIN 300 MG PO CAPS
300.0000 mg | ORAL_CAPSULE | ORAL | Status: AC
  Administered 2023-08-11: 300 mg via ORAL
  Filled 2023-08-11: qty 1

## 2023-08-11 MED ORDER — HYDROMORPHONE HCL 1 MG/ML IJ SOLN
0.2500 mg | INTRAMUSCULAR | Status: DC | PRN
  Administered 2023-08-11 (×3): 0.5 mg via INTRAVENOUS

## 2023-08-11 MED ORDER — SUGAMMADEX SODIUM 200 MG/2ML IV SOLN
INTRAVENOUS | Status: DC | PRN
  Administered 2023-08-11: 200 mg via INTRAVENOUS

## 2023-08-11 MED ORDER — ACETAMINOPHEN 500 MG PO TABS: 1000.0000 mg | ORAL_TABLET | Freq: Once | ORAL | Status: DC

## 2023-08-11 MED ORDER — OXYCODONE HCL 5 MG/5ML PO SOLN
5.0000 mg | Freq: Once | ORAL | Status: AC | PRN
  Administered 2023-08-11: 5 mg via ORAL

## 2023-08-11 MED ORDER — PROPOFOL 10 MG/ML IV BOLUS
INTRAVENOUS | Status: AC
  Filled 2023-08-11: qty 20

## 2023-08-11 MED ORDER — PHENYLEPHRINE HCL (PRESSORS) 10 MG/ML IV SOLN
INTRAVENOUS | Status: DC | PRN
Start: 2023-08-11 — End: 2023-08-11
  Administered 2023-08-11: 160 ug via INTRAVENOUS

## 2023-08-11 MED ORDER — MEPERIDINE HCL 50 MG/ML IJ SOLN: 6.2500 mg | INTRAMUSCULAR | Status: DC | PRN

## 2023-08-11 MED ORDER — BUPIVACAINE HCL 0.25 % IJ SOLN
INTRAMUSCULAR | Status: DC | PRN
  Administered 2023-08-11: 30 mL

## 2023-08-11 MED ORDER — LACTATED RINGERS IV SOLN
INTRAVENOUS | Status: DC | PRN
Start: 2023-08-11 — End: 2023-08-11

## 2023-08-11 MED ORDER — LIDOCAINE HCL (CARDIAC) PF 100 MG/5ML IV SOSY
PREFILLED_SYRINGE | INTRAVENOUS | Status: DC | PRN
  Administered 2023-08-11: 80 mg via INTRAVENOUS

## 2023-08-11 MED ORDER — ACETAMINOPHEN 500 MG PO TABS
1000.0000 mg | ORAL_TABLET | ORAL | Status: AC
  Administered 2023-08-11: 1000 mg via ORAL
  Filled 2023-08-11: qty 2

## 2023-08-11 MED ORDER — GLYCOPYRROLATE 0.2 MG/ML IJ SOLN
INTRAMUSCULAR | Status: DC | PRN
Start: 2023-08-11 — End: 2023-08-11
  Administered 2023-08-11: .2 mg via INTRAVENOUS

## 2023-08-11 MED ORDER — HYDROMORPHONE HCL 1 MG/ML IJ SOLN
INTRAMUSCULAR | Status: AC
  Administered 2023-08-11: 0.5 mg via INTRAVENOUS
  Filled 2023-08-11: qty 1

## 2023-08-11 MED ORDER — ORAL CARE MOUTH RINSE: 15.0000 mL | Freq: Once | OROMUCOSAL | Status: DC

## 2023-08-11 MED ORDER — HEPARIN SODIUM (PORCINE) 5000 UNIT/ML IJ SOLN
5000.0000 [IU] | INTRAMUSCULAR | Status: AC
  Administered 2023-08-11: 5000 [IU] via SUBCUTANEOUS
  Filled 2023-08-11: qty 1

## 2023-08-11 MED ORDER — STERILE WATER FOR IRRIGATION IR SOLN
Status: DC | PRN
  Administered 2023-08-11: 1000 mL

## 2023-08-11 MED ORDER — KETOROLAC TROMETHAMINE 30 MG/ML IJ SOLN
INTRAMUSCULAR | Status: AC
  Filled 2023-08-11: qty 1

## 2023-08-11 MED ORDER — FENTANYL CITRATE (PF) 250 MCG/5ML IJ SOLN
INTRAMUSCULAR | Status: AC
  Filled 2023-08-11: qty 5

## 2023-08-11 MED ORDER — FENTANYL CITRATE PF 50 MCG/ML IJ SOSY
25.0000 ug | PREFILLED_SYRINGE | INTRAMUSCULAR | Status: DC | PRN
  Administered 2023-08-11 (×2): 50 ug via INTRAVENOUS

## 2023-08-11 MED ORDER — ACETAMINOPHEN 325 MG PO TABS: 325.0000 mg | ORAL_TABLET | ORAL | Status: DC | PRN

## 2023-08-11 MED ORDER — ONDANSETRON HCL 4 MG/2ML IJ SOLN
INTRAMUSCULAR | Status: DC | PRN
  Administered 2023-08-11: 4 mg via INTRAVENOUS

## 2023-08-11 MED ORDER — MIDAZOLAM HCL 2 MG/2ML IJ SOLN
INTRAMUSCULAR | Status: AC
  Filled 2023-08-11: qty 2

## 2023-08-11 MED ORDER — ACETAMINOPHEN 160 MG/5ML PO SOLN: 325.0000 mg | ORAL | Status: DC | PRN

## 2023-08-11 MED ORDER — PROPOFOL 10 MG/ML IV BOLUS
INTRAVENOUS | Status: DC | PRN
  Administered 2023-08-11: 150 mg via INTRAVENOUS

## 2023-08-11 MED ORDER — FENTANYL CITRATE PF 50 MCG/ML IJ SOSY
PREFILLED_SYRINGE | INTRAMUSCULAR | Status: AC
  Filled 2023-08-11: qty 1

## 2023-08-11 MED ORDER — CEFAZOLIN SODIUM-DEXTROSE 2-4 GM/100ML-% IV SOLN
2.0000 g | INTRAVENOUS | Status: AC
  Administered 2023-08-11: 2 g via INTRAVENOUS
  Filled 2023-08-11: qty 100

## 2023-08-11 MED ORDER — DEXAMETHASONE SODIUM PHOSPHATE 4 MG/ML IJ SOLN
4.0000 mg | INTRAMUSCULAR | Status: AC
  Administered 2023-08-11: 4 mg via INTRAVENOUS

## 2023-08-11 MED ORDER — LACTATED RINGERS IV SOLN: INTRAVENOUS | Status: DC

## 2023-08-11 MED ORDER — ROCURONIUM BROMIDE 100 MG/10ML IV SOLN
INTRAVENOUS | Status: DC | PRN
  Administered 2023-08-11: 60 mg via INTRAVENOUS
  Administered 2023-08-11: 20 mg via INTRAVENOUS

## 2023-08-11 MED ORDER — OXYCODONE HCL 5 MG PO TABS: 5.0000 mg | ORAL_TABLET | Freq: Once | ORAL | Status: AC | PRN

## 2023-08-11 MED ORDER — KETOROLAC TROMETHAMINE 30 MG/ML IJ SOLN
30.0000 mg | Freq: Once | INTRAMUSCULAR | Status: AC
  Administered 2023-08-11: 30 mg via INTRAVENOUS

## 2023-08-11 MED ORDER — ONDANSETRON HCL 4 MG/2ML IJ SOLN: 4.0000 mg | Freq: Once | INTRAMUSCULAR | Status: DC | PRN

## 2023-08-11 MED ORDER — LIDOCAINE 2% (20 MG/ML) 5 ML SYRINGE
INTRAMUSCULAR | Status: DC | PRN
Start: 2023-08-11 — End: 2023-08-11
  Administered 2023-08-11: 1.5 mg/kg/h via INTRAVENOUS

## 2023-08-11 MED ORDER — FENTANYL CITRATE (PF) 100 MCG/2ML IJ SOLN
INTRAMUSCULAR | Status: DC | PRN
  Administered 2023-08-11: 50 ug via INTRAVENOUS
  Administered 2023-08-11: 100 ug via INTRAVENOUS

## 2023-08-11 MED ORDER — LACTATED RINGERS IR SOLN
Status: DC | PRN
  Administered 2023-08-11: 1000 mL

## 2023-08-11 MED ORDER — SCOPOLAMINE 1 MG/3DAYS TD PT72
1.0000 | MEDICATED_PATCH | TRANSDERMAL | Status: DC
  Administered 2023-08-11: 1.5 mg via TRANSDERMAL
  Filled 2023-08-11: qty 1

## 2023-08-11 SURGICAL SUPPLY — 84 items
ADH SKN CLS APL DERMABOND .7 (GAUZE/BANDAGES/DRESSINGS) ×1
AGENT HMST KT MTR STRL THRMB (HEMOSTASIS)
APL ESCP 34 STRL LF DISP (HEMOSTASIS)
APL SRG 38 LTWT LNG FL B (MISCELLANEOUS) ×1
APPLICATOR ARISTA FLEXITIP XL (MISCELLANEOUS) IMPLANT
APPLICATOR SURGIFLO ENDO (HEMOSTASIS) IMPLANT
BAG COUNTER SPONGE SURGICOUNT (BAG) IMPLANT
BAG LAPAROSCOPIC 12 15 PORT 16 (BASKET) IMPLANT
BAG RETRIEVAL 12/15 (BASKET)
BAG SPNG CNTER NS LX DISP (BAG)
BLADE SURG SZ10 CARB STEEL (BLADE) IMPLANT
COVER BACK TABLE 60X90IN (DRAPES) ×1 IMPLANT
COVER TIP SHEARS 8 DVNC (MISCELLANEOUS) ×1 IMPLANT
DERMABOND ADVANCED .7 DNX12 (GAUZE/BANDAGES/DRESSINGS) ×1 IMPLANT
DRAPE ARM DVNC X/XI (DISPOSABLE) ×4 IMPLANT
DRAPE COLUMN DVNC XI (DISPOSABLE) ×1 IMPLANT
DRAPE SHEET LG 3/4 BI-LAMINATE (DRAPES) ×1 IMPLANT
DRAPE SURG IRRIG POUCH 19X23 (DRAPES) ×1 IMPLANT
DRIVER NDL MEGA SUTCUT DVNCXI (INSTRUMENTS) ×1 IMPLANT
DRIVER NDLE MEGA SUTCUT DVNCXI (INSTRUMENTS) ×1
DRSG OPSITE POSTOP 4X6 (GAUZE/BANDAGES/DRESSINGS) IMPLANT
DRSG OPSITE POSTOP 4X8 (GAUZE/BANDAGES/DRESSINGS) IMPLANT
ELECT PENCIL ROCKER SW 15FT (MISCELLANEOUS) IMPLANT
ELECT REM PT RETURN 15FT ADLT (MISCELLANEOUS) ×1 IMPLANT
FORCEPS BPLR FENES DVNC XI (FORCEP) ×1 IMPLANT
FORCEPS PROGRASP DVNC XI (FORCEP) ×1 IMPLANT
GAUZE 4X4 16PLY ~~LOC~~+RFID DBL (SPONGE) ×2 IMPLANT
GLOVE BIO SURGEON STRL SZ 6 (GLOVE) ×4 IMPLANT
GLOVE BIO SURGEON STRL SZ 6.5 (GLOVE) ×1 IMPLANT
GOWN STRL REUS W/ TWL LRG LVL3 (GOWN DISPOSABLE) ×4 IMPLANT
GOWN STRL REUS W/TWL LRG LVL3 (GOWN DISPOSABLE) ×5
GRASPER SUT TROCAR 14GX15 (MISCELLANEOUS) IMPLANT
HEMOSTAT ARISTA ABSORB 3G PWDR (HEMOSTASIS) IMPLANT
HOLDER FOLEY CATH W/STRAP (MISCELLANEOUS) IMPLANT
IRRIG SUCT STRYKERFLOW 2 WTIP (MISCELLANEOUS) ×1
IRRIGATION SUCT STRKRFLW 2 WTP (MISCELLANEOUS) ×1 IMPLANT
KIT PROCEDURE DVNC SI (MISCELLANEOUS) IMPLANT
KIT TURNOVER KIT A (KITS) IMPLANT
LIGASURE IMPACT 36 18CM CVD LR (INSTRUMENTS) IMPLANT
MANIPULATOR ADVINCU DEL 3.0 PL (MISCELLANEOUS) IMPLANT
MANIPULATOR ADVINCU DEL 3.5 PL (MISCELLANEOUS) IMPLANT
MANIPULATOR UTERINE 4.5 ZUMI (MISCELLANEOUS) IMPLANT
NDL HYPO 21X1.5 SAFETY (NEEDLE) ×1 IMPLANT
NDL SPNL 18GX3.5 QUINCKE PK (NEEDLE) IMPLANT
NEEDLE HYPO 21X1.5 SAFETY (NEEDLE) ×1
NEEDLE SPNL 18GX3.5 QUINCKE PK (NEEDLE)
OBTURATOR OPTICAL STND 8 DVNC (TROCAR) ×1
OBTURATOR OPTICALSTD 8 DVNC (TROCAR) ×1 IMPLANT
PACK ROBOT GYN CUSTOM WL (TRAY / TRAY PROCEDURE) ×1 IMPLANT
PAD POSITIONING PINK XL (MISCELLANEOUS) ×1 IMPLANT
PORT ACCESS TROCAR AIRSEAL 12 (TROCAR) IMPLANT
SCISSORS MNPLR CVD DVNC XI (INSTRUMENTS) ×1 IMPLANT
SCRUB CHG 4% DYNA-HEX 4OZ (MISCELLANEOUS) IMPLANT
SEAL UNIV 5-12 XI (MISCELLANEOUS) ×4 IMPLANT
SET TRI-LUMEN FLTR TB AIRSEAL (TUBING) ×1 IMPLANT
SPIKE FLUID TRANSFER (MISCELLANEOUS) ×1 IMPLANT
SPONGE T-LAP 18X18 ~~LOC~~+RFID (SPONGE) IMPLANT
SURGIFLO W/THROMBIN 8M KIT (HEMOSTASIS) IMPLANT
SUT MNCRL AB 4-0 PS2 18 (SUTURE) IMPLANT
SUT PDS AB 1 TP1 96 (SUTURE) IMPLANT
SUT V-LOC 180 0-0 GS22 (SUTURE) IMPLANT
SUT VIC AB 0 CT1 27 (SUTURE)
SUT VIC AB 0 CT1 27XBRD ANTBC (SUTURE) IMPLANT
SUT VIC AB 2-0 CT1 27 (SUTURE)
SUT VIC AB 2-0 CT1 TAPERPNT 27 (SUTURE) IMPLANT
SUT VIC AB 2-0 SH 27 (SUTURE) ×1
SUT VIC AB 2-0 SH 27X BRD (SUTURE) IMPLANT
SUT VIC AB 4-0 PS2 18 (SUTURE) ×2 IMPLANT
SUT VICRYL 0 27 CT2 27 ABS (SUTURE) ×1 IMPLANT
SUT VLOC 180 0 9IN GS21 (SUTURE) IMPLANT
SYR 10ML LL (SYRINGE) IMPLANT
SYS BAG RETRIEVAL 10MM (BASKET)
SYS RETRIEVAL 5MM INZII UNIV (BASKET) ×1
SYS WOUND ALEXIS 18CM MED (MISCELLANEOUS)
SYSTEM BAG RETRIEVAL 10MM (BASKET) IMPLANT
SYSTEM RETRIEVL 5MM INZII UNIV (BASKET) IMPLANT
SYSTEM WOUND ALEXIS 18CM MED (MISCELLANEOUS) IMPLANT
TOWEL OR NON WOVEN STRL DISP B (DISPOSABLE) IMPLANT
TRAP SPECIMEN MUCUS 40CC (MISCELLANEOUS) IMPLANT
TRAY FOLEY MTR SLVR 16FR STAT (SET/KITS/TRAYS/PACK) ×1 IMPLANT
TROCAR PORT AIRSEAL 5X120 (TROCAR) IMPLANT
UNDERPAD 30X36 HEAVY ABSORB (UNDERPADS AND DIAPERS) ×2 IMPLANT
WATER STERILE IRR 1000ML POUR (IV SOLUTION) ×1 IMPLANT
YANKAUER SUCT BULB TIP 10FT TU (MISCELLANEOUS) IMPLANT

## 2023-08-11 NOTE — H&P (Signed)
Gynecologic Oncology H&P  08/11/23  Treatment History: The patient was recently seen by OB/GYN for vulvar lesion and postmenopausal bleeding.   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 This biopsy was performed at her new patient visit.  04/21/23: Returned for follow-up for larger vulvar excision and endometrial biopsy in the setting of PMB. EMB -fragments of negative ecto and endocervical tissue, predominantly mucus.  No endometrial tissue identified.  Excision of vulvar lesion shows benign skin having reactive changes consistent with prior biopsy.  No residual dysplasia or malignancy.   Ultrasound from [facility] on 04/21/2023: There is measures 5.8 x 3.9 x 2.7 cm with an endometrial lining of 1.5 mm.  Bilateral ovaries normal in appearance.  There is a 2.4 cm heterogenous focal mass with increased vascularity in the lower uterine segment/cervix.  Possible subserosal fibroid versus cervical mass.   Pelvic MRI on 7/16 shows a 2 x 2.4 x 2.6 cm enhancing lesion along the right lateral aspect of the lower uterine segment/cervix.  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  Adjacent susceptibility artifact related to low anterior C-section.  No pelvic ascites.  Normal ovaries.  No evidence of metastatic disease.   The patient reports overall doing well.  Her history is notable for abnormal Pap smears with a LEEP performed in 2015 per her report.  She endorses having a Pap smear in 2023 or 2024 that was normal.  Her menses stopped in 2013 after her endometrial ablation.   07/15/23: CT guided transcervical biopsy of LUS/upper cervical mass with IR. Pathology: Benign smooth muscle c/w leiomyoma, no malignancy.  Interval History: Doing well, mild constipation.  Past Medical/Surgical History: Past Medical History:  Diagnosis Date   Arthritis    Back pain    Depression    GERD (gastroesophageal reflux disease)    Lichen sclerosus    Pneumonia    Thyroid disease    Vulvar dysplasia      Past Surgical History:  Procedure Laterality Date   ABDOMINOPLASTY     CESAREAN SECTION  1998   and in 2002   ECTOPIC PREGNANCY SURGERY Right 1997   ENDOMETRIAL ABLATION  2013   MENISCUS REPAIR Left 2004   TUBAL LIGATION  2013    Family History  Problem Relation Age of Onset   Irritable bowel syndrome Mother    Cancer Paternal Aunt    Breast cancer Paternal Aunt    Colon cancer Neg Hx    Ovarian cancer Neg Hx    Endometrial cancer Neg Hx    Pancreatic cancer Neg Hx    Prostate cancer Neg Hx     Social History   Socioeconomic History   Marital status: Widowed    Spouse name: Not on file   Number of children: Not on file   Years of education: Not on file   Highest education level: Not on file  Occupational History   Occupation: Dealer  Tobacco Use   Smoking status: Former    Types: Cigarettes   Smokeless tobacco: Not on file  Vaping Use   Vaping status: Some Days  Substance and Sexual Activity   Alcohol use: Not Currently   Drug use: Never   Sexual activity: Yes    Birth control/protection: None  Other Topics Concern   Not on file  Social History Narrative   Not on file   Social Determinants of Health   Financial Resource Strain: Not on file  Food Insecurity: No Food Insecurity (06/29/2023)   Hunger Vital  Sign    Worried About Programme researcher, broadcasting/film/video in the Last Year: Never true    Ran Out of Food in the Last Year: Never true  Transportation Needs: No Transportation Needs (06/29/2023)   PRAPARE - Administrator, Civil Service (Medical): No    Lack of Transportation (Non-Medical): No  Physical Activity: Not on file  Stress: Not on file  Social Connections: Unknown (04/07/2022)   Received from [facility]   Social Network    Social Network: Not on file    Current Medications:  Current Facility-Administered Medications:    ceFAZolin (ANCEF) IVPB 2g/100 mL premix, 2 g, Intravenous, On Call to OR, Cross, Melissa  D, NP   chlorhexidine (PERIDEX) 0.12 % solution 15 mL, 15 mL, Mouth/Throat, Once **OR** Oral care mouth rinse, 15 mL, Mouth Rinse, Once, Linton Rump, MD   dexamethasone (DECADRON) injection 4 mg, 4 mg, Intravenous, On Call to OR, Cross, Laurene Footman, NP   lactated ringers infusion, , Intravenous, Continuous, Linton Rump, MD, Last Rate: 10 mL/hr at 08/11/23 0710, New Bag at 08/11/23 0710   scopolamine (TRANSDERM-SCOP) 1 MG/3DAYS 1.5 mg, 1 patch, Transdermal, On Call to OR, Cross, Melissa D, NP, 1.5 mg at 08/11/23 0707  Review of Systems: Denies appetite changes, fevers, chills, fatigue, unexplained weight changes. Denies hearing loss, neck lumps or masses, mouth sores, ringing in ears or voice changes. Denies cough or wheezing.  Denies shortness of breath. Denies chest pain or palpitations. Denies leg swelling. Denies abdominal distention, pain, blood in stools, constipation, diarrhea, nausea, vomiting, or early satiety. Denies pain with intercourse, dysuria, frequency, hematuria or incontinence. Denies hot flashes, pelvic pain, vaginal bleeding or vaginal discharge.   Denies joint pain, back pain or muscle pain/cramps. Denies itching, rash, or wounds. Denies dizziness, headaches, numbness or seizures. Denies swollen lymph nodes or glands, denies easy bruising or bleeding. Denies anxiety, depression, confusion, or decreased concentration.  Physical Exam: BP 124/82   Temp 98.2 F (36.8 C) (Oral)   Resp 18   Ht 5\' 6"  (1.676 m)   Wt 183 lb (83 kg)   SpO2 95%   BMI 29.54 kg/m  General: Alert, oriented, no acute distress.  HEENT: Normocephalic, atraumatic. Sclera anicteric.  Chest: Clear to auscultation bilaterally. No wheezes, rhonchi, or rales. Cardiovascular: Regular rate and rhythm, no murmurs, rubs, or gallops.  Abdomen: Normoactive bowel sounds. Soft, nondistended, nontender to palpation. No masses or hepatosplenomegaly appreciated. No palpable fluid wave.   Extremities: Grossly normal range of motion. Warm, well perfused. No edema bilaterally.   Laboratory & Radiologic Studies:    Latest Ref Rng & Units 08/05/2023    2:37 PM 07/15/2023    7:30 AM 08/03/2019   10:49 AM  CBC  WBC 4.0 - 10.5 K/uL 8.7  7.5  9.8   Hemoglobin 12.0 - 15.0 g/dL 16.1  09.6  04.5   Hematocrit 36.0 - 46.0 % 39.1  38.2  45.4   Platelets 150 - 400 K/uL 303  284  340       Latest Ref Rng & Units 08/05/2023    2:37 PM 08/03/2019   10:49 AM  BMP  Glucose 70 - 99 mg/dL 92  409   BUN 6 - 20 mg/dL 11  8   Creatinine 8.11 - 1.00 mg/dL 9.14  7.82   Sodium 956 - 145 mmol/L 140  139   Potassium 3.5 - 5.1 mmol/L 3.9  4.3   Chloride 98 - 111 mmol/L  106  103   CO2 22 - 32 mmol/L 26  27   Calcium 8.9 - 10.3 mg/dL 9.5  9.6    Assessment & Plan: Carolyn White is a 57 y.o. woman with lower uterine segment/upper cervical mass c/w benign fibroid on reecnt biopsy. After discussion, patient desires definitive surgical therapy.  We reviewed the plan for a robotic assisted hysterectomy, bilateral salpingo-oophorectomy, possible laparotomy, and any other indicated procedures. Plan will be to send the uterus for frozen for confirmation of benign LUS/cervical mass. The risks of surgery were discussed in detail and she understands these to include infection; wound separation; hernia; vaginal cuff separation, injury to adjacent organs such as bowel, bladder, blood vessels, ureters and nerves; bleeding which may require blood transfusion; anesthesia risk; thromboembolic events; possible death; unforeseen complications; possible need for re-exploration; medical complications such as heart attack, stroke, pleural effusion and pneumonia. The patient will receive DVT and antibiotic prophylaxis as indicated. She voiced a clear understanding. She had the opportunity to ask questions.  Eugene Garnet, MD  Division of Gynecologic Oncology  Department of Obstetrics and Gynecology  [facility]

## 2023-08-11 NOTE — Transfer of Care (Signed)
Immediate Anesthesia Transfer of Care Note  Patient: Carolyn White  Procedure(s) Performed: XI ROBOTIC ASSISTED TOTAL HYSTERECTOMY WITH BILATERAL SALPINGO OOPHORECTOMY (Bilateral)  Patient Location: PACU  Anesthesia Type:General  Level of Consciousness: drowsy and patient cooperative  Airway & Oxygen Therapy: Patient Spontanous Breathing and Patient connected to face mask oxygen  Post-op Assessment: Report given to RN and Post -op Vital signs reviewed and stable  Post vital signs: Reviewed and stable  Last Vitals:  Vitals Value Taken Time  BP 131/72 08/11/23 1115  Temp    Pulse 65 08/11/23 1118  Resp 18 08/11/23 1118  SpO2 100 % 08/11/23 1118  Vitals shown include unfiled device data.  Last Pain:  Vitals:   08/11/23 0655  TempSrc: Oral         Complications: No notable events documented.

## 2023-08-11 NOTE — Discharge Instructions (Addendum)
AFTER SURGERY INSTRUCTIONS   Return to work: 4-6 weeks if applicable  Today, Dr. Pricilla Holm removed your uterus, cervix, both fallopian tubes and ovaries. The pathologist felt the uterine mass previous biopsied did not look like cancer and we will wait for final pathology for confirmation.   Your left ovary was adhered or scarred to your colon. Dr. Pricilla Holm placed two stitches in this area where she removed the ovary off the colon. You will need to be on an aggressive bowel regimen including taking something daily for at least the next one or two to prevent constipation.   You have flagyl, the antibiotic, on your medication list. If you have completed the full prescribed course, disregard recommendation to continue listed on your instructions.   Activity: 1. Be up and out of the bed during the day.  Take a nap if needed.  You may walk up steps but be careful and use the hand rail.  Stair climbing will tire you more than you think, you may need to stop part way and rest.    2. No lifting or straining for 6 weeks over 10 pounds. No pushing, pulling, straining for 6 weeks.   3. No driving for around 1 week(s).  Do not drive if you are taking narcotic pain medicine and make sure that your reaction time has returned.    4. You can shower as soon as the next day after surgery. Shower daily.  Use your regular soap and water (not directly on the incision) and pat your incision(s) dry afterwards; don't rub.  No tub baths or submerging your body in water until cleared by your surgeon. If you have the soap that was given to you by pre-surgical testing that was used before surgery, you do not need to use it afterwards because this can irritate your incisions.    5. No sexual activity and nothing in the vagina for 8-10 weeks.   6. You may experience a small amount of clear drainage from your incisions, which is normal.  If the drainage persists, increases, or changes color please call the office.   7. Do not use  creams, lotions, or ointments such as neosporin on your incisions after surgery until advised by your surgeon because they can cause removal of the dermabond glue on your incisions.     8. You may experience vaginal spotting after surgery or when the stitches at the top of the vagina begin to dissolve.  The spotting is normal but if you experience heavy bleeding, call our office.   9. Take Tylenol or ibuprofen first for pain if you are able to take these medications and only use narcotic pain medication for severe pain not relieved by the Tylenol or Ibuprofen.  Monitor your Tylenol intake to a max of 4,000 mg in a 24 hour period. You can alternate these medications after surgery.   Diet: 1. Low sodium Heart Healthy Diet is recommended but you are cleared to resume your normal (before surgery) diet after your procedure.   2. It is safe to use a laxative, such as Miralax or Colace, if you have difficulty moving your bowels. You have been prescribed Sennakot-S to take at bedtime every evening after surgery to keep bowel movements regular and to prevent constipation.     Wound Care: 1. Keep clean and dry.  Shower daily.   Reasons to call the Doctor: Fever - Oral temperature greater than 100.4 degrees Fahrenheit Foul-smelling vaginal discharge Difficulty urinating Nausea and vomiting Increased  pain at the site of the incision that is unrelieved with pain medicine. Difficulty breathing with or without chest pain New calf pain especially if only on one side Sudden, continuing increased vaginal bleeding with or without clots.   Contacts: For questions or concerns you should contact:   Dr. Eugene Garnet at [phone]   Warner Mccreedy, NP at [phone]   After Hours: call [phone] and have the GYN Oncologist paged/contacted (after 5 pm or on the weekends). You will speak with an after hours RN and let he or she know you have had surgery.   Messages sent via mychart are for non-urgent  matters and are not responded to after hours so for urgent needs, please call the after hours number.

## 2023-08-11 NOTE — Op Note (Signed)
OPERATIVE NOTE  Pre-operative Diagnosis: Lower uterine segment mass (fibroid on transvaginal biopsy)  Post-operative Diagnosis: same, pelvic adhesive disease, diverticular disease  Operation: Robotic-assisted laparoscopic lysis of adhesions for approximately 20 minutes, total hysterectomy with bilateral salpingo-oophorectomy, enterolysis  Surgeon: Eugene Garnet MD  Assistant Surgeon: Warner Mccreedy, NP (an NP assistant was necessary for tissue manipulation, management of robotic instrumentation, retraction and positioning due to the complexity of the case and hospital policies).   Anesthesia: GET  Urine Output: 200 cc  Operative Findings:  : On EUA, adhesions of the omentum to the anterior abdominal wall just below the umbilicus. Normal appearing upper abdominal survey. Normal small bowel. Diverticular disease of the sigmoid noted. Uterus 6 cm with a 2 cm posterior LUS mass. Normal right adnexa although missing portion of the fallopian tube. Sigmoid epiploica adherent to the left pelvic sidewall and anteriorly to the round ligament. Left ovary overall normal in appearance but densely adherent to the sigmoid mesentery and colon. Adhesions between the bladder and LUS/cervix c/w prior c-section history.  Frozen section of the posterior uterine mass most c/w a benign fibroid.   Estimated Blood Loss:  50 cc      Total IV Fluids: see I&O flowsheet         Specimens: uterus, cervix, bilateral tubes and ovaries (minimal tubal remnants noted bilaterally)         Complications:  None apparent; patient tolerated the procedure well.         Disposition: PACU - hemodynamically stable.  Procedure Details  The patient was seen in the Holding Room. The risks, benefits, complications, treatment options, and expected outcomes were discussed with the patient.  The patient concurred with the proposed plan, giving informed consent.  The site of surgery properly noted/marked. The patient was identified as  [name] and the procedure verified as a Robotic-assisted hysterectomy with bilateral salpingo oophorectomy.   After induction of anesthesia, the patient was draped and prepped in the usual sterile manner. Patient was placed in supine position after anesthesia and draped and prepped in the usual sterile manner as follows: Her arms were tucked to her side with all appropriate precautions.  The patient was secured to the bed using padding and tape across her chest.  The patient was placed in the semi-lithotomy position in [place] stirrups.  The perineum and vagina were prepped with Betadine. The patient's abdomen was prepped with ChloraPrep and then she was draped after the prep had been allowed to dry for 3 minutes.  A Time Out was held and the above information confirmed.  The urethra was prepped with Betadine. Foley catheter was placed.  A sterile speculum was placed in the vagina.  The cervix was grasped with a single-tooth tenaculum. The cervix was dilated with Shawnie Pons dilators.  The ZUMI uterine manipulator with a medium colpotomizer ring was placed without difficulty.  A pneum occluder balloon was placed over the manipulator.  OG tube placement was confirmed and to suction.   Next, a 5 mm skin incision was made 1 cm below the subcostal margin in the midclavicular line.  The 5 mm Optiview port and scope was used for direct entry.  Opening pressure was under 10 mm CO2.  The abdomen was insufflated and the findings were noted as above.   At this point and all points during the procedure, the patient's intra-abdominal pressure did not exceed 15 mmHg. Next, an 8 mm skin incision was made superior to the umbilicus and a right and left port  were placed about 8 cm lateral to the robot port on the right and left side.  A fourth arm was placed on the right.  The 5 mm assist trocar was exchanged for a 5 mm airseal port. All ports were placed under direct visualization.  The patient was placed in steep Trendelenburg.   The robot was docked in the normal manner.  Attention was turned to the omental adhesions, which were taken down with a combination of blunt dissection, sharp dissection and judicious use of monopolar and bipolar electrocautery. Once free, the omentum was moved into the upper abdomen and small bowel was moved out of the pelvis.  The right peritoneum was opened parallel to the IP ligament to open the retroperitoneal space. The round ligament was transected. The ureter was noted to be on the medial leaf of the broad ligament.  The peritoneum above the ureter was incised and stretched and the infundibulopelvic ligament was skeletonized, cauterized and cut.    On the left, the adhesions of sigmoid epiploica were lysed sharply to mobilize the colon from the pelvic sidewall and round ligament. The left peritoneum was opened parallel to the IP ligament to open the retroperitoneal space. The round ligament was transected. The sigmoid colon was mobilized superior to the pelvic brim along the side wall. Attention was tuned back to the pelvis. The ureter was noted to be on the medial leaf of the broad ligament.  The peritoneum above the ureter was incised and stretched and the infundibulopelvic ligament was skeletonized, cauterized and cut.  The utero-ovarian ligament was isolated, cauterized, and transected. The left ovary was left attached to the sigmoid and mesentery until after the hysterectomy.   The posterior peritoneum was taken down to the level of the KOH ring.  The anterior peritoneum was also taken down.  The bladder flap was created to the level of the KOH ring.  The uterine artery on the right side was skeletonized, cauterized and cut in the normal manner.  A similar procedure was performed on the left.  The colpotomy was made and the uterus, cervix, bilateral ovaries and tubes were amputated and delivered through the vagina.  Pedicles were inspected and excellent hemostasis was achieved.  The uterus was  sent for frozen section.  Attention was then turned to the ovary and sigmoid colon. Inferiorly, the ovary was adherent to the serosa of the sigmoid. Sharp dissection was used to free the ovary at this location. Along the mesentery, given some difficulty distinguishing whether there were any underlying diverticula, decision was made to favor leaving small amount of ovarian surface on the sigmoid mesentery. This was done with a combination of sharp and blunt dissection, short bursts of judicious electrocautery. The left adnexa was placed in an endocatch bag and delivered through the vagina.  Attention was turned back to the sigmoid. It was difficult to tell if there had been minor serosal disruption at the inferior aspect of the sigmoid where ovary had been attached. Two interrupted stitches using 2-0 Vicryl were placed to reinforce the serosa. Arista was placed and pressure held to achieve hemostasis along the mesentery.  The colpotomy at the vaginal cuff was closed with 0 Vicryl with a figure of eight at each apex and a two layer closure with 0 V-Loc to close the midportion of the cuff in a running manner.  Irrigation was used and excellent hemostasis was achieved.  Additional arista was placed along the sigmoid mesentery. The pressure was decreased in the abdomen to  5 mmHg with excellent hemostasis maintained. At this point in the procedure was completed.  Robotic instruments were removed under direct visulaization.  The robot was undocked. The subcuticular tissue was closed with 4-0 Vicryl and the skin was closed with 4-0 Monocryl in a subcuticular manner.  Dermabond was applied.    The vagina was swabbed with  minimal bleeding noted. Foley catheter was removed.  All sponge, lap and needle counts were correct x  3.   The patient was transferred to the recovery room in stable condition.  Eugene Garnet, MD

## 2023-08-11 NOTE — Anesthesia Postprocedure Evaluation (Signed)
Anesthesia Post Note  Patient: Carolyn White  Procedure(s) Performed: XI ROBOTIC ASSISTED TOTAL HYSTERECTOMY WITH BILATERAL SALPINGO OOPHORECTOMY (Bilateral)     Patient location during evaluation: PACU Anesthesia Type: General Level of consciousness: awake and alert Pain management: pain level controlled Vital Signs Assessment: post-procedure vital signs reviewed and stable Respiratory status: spontaneous breathing, nonlabored ventilation, respiratory function stable and patient connected to nasal cannula oxygen Cardiovascular status: blood pressure returned to baseline and stable Postop Assessment: no apparent nausea or vomiting Anesthetic complications: no   No notable events documented.  Last Vitals:  Vitals:   08/11/23 1330 08/11/23 1345  BP: 113/70 106/69  Pulse: 69 (!) 58  Resp: 13 15  Temp: (!) 36.3 C (!) 36.3 C  SpO2:  97%    Last Pain:  Vitals:   08/11/23 1345  TempSrc:   PainSc: 1                  Arlind Klingerman

## 2023-08-11 NOTE — Anesthesia Procedure Notes (Signed)
Procedure Name: Intubation Date/Time: 08/11/2023 9:01 AM  Performed by: Deri Fuelling, CRNAPre-anesthesia Checklist: Patient identified, Emergency Drugs available, Suction available and Patient being monitored Patient Re-evaluated:Patient Re-evaluated prior to induction Oxygen Delivery Method: Circle system utilized Preoxygenation: Pre-oxygenation with 100% oxygen Induction Type: IV induction Ventilation: Mask ventilation without difficulty Laryngoscope Size: Mac and 3 Grade View: Grade I Tube type: Oral Tube size: 7.0 mm Number of attempts: 1 Airway Equipment and Method: Stylet and Oral airway Placement Confirmation: ETT inserted through vocal cords under direct vision, positive ETCO2 and breath sounds checked- equal and bilateral Secured at: 21 cm Tube secured with: Tape Dental Injury: Teeth and Oropharynx as per pre-operative assessment

## 2023-08-12 ENCOUNTER — Telehealth: Payer: Self-pay | Admitting: *Deleted

## 2023-08-12 ENCOUNTER — Encounter (HOSPITAL_COMMUNITY): Payer: Self-pay | Admitting: Gynecologic Oncology

## 2023-08-12 LAB — SURGICAL PATHOLOGY

## 2023-08-12 NOTE — Addendum Note (Signed)
Addendum  created 08/12/23 0949 by Deri Fuelling, CRNA   Intraprocedure Event edited

## 2023-08-12 NOTE — Telephone Encounter (Signed)
Spoke with Carolyn White this morning. She states she is eating, drinking and urinating well. She has not had a BM yet but is passing gas. She is taking senokot as prescribed and encouraged her to drink plenty of water. She denies fever or chills. Incisions are dry and intact. She rates her pain this morning 8/10 and took tramadol for the first time, she has been taking tylenol and ibuprofen prior to for a pain 5/10. Advised patient it's ok to take the tramadol when needed for a pain great than 7/10. And call the office if her pain increases without relief.   Instructed to call office with any fever, chills, purulent drainage, uncontrolled pain or any other questions or concerns. Patient verbalizes understanding.   Pt aware of post op appointments as well as the office number [phone] and after hours number [phone] to call if she has any questions or concerns

## 2023-08-13 ENCOUNTER — Inpatient Hospital Stay: Payer: No Typology Code available for payment source | Attending: Gynecologic Oncology

## 2023-08-13 ENCOUNTER — Ambulatory Visit (HOSPITAL_COMMUNITY)
Admission: RE | Admit: 2023-08-13 | Discharge: 2023-08-13 | Disposition: A | Discharge status: Home / Self Care | Payer: No Typology Code available for payment source | Source: Ambulatory Visit | Attending: Gynecologic Oncology | Admitting: Gynecologic Oncology

## 2023-08-13 ENCOUNTER — Other Ambulatory Visit: Payer: Self-pay | Admitting: Gynecologic Oncology

## 2023-08-13 ENCOUNTER — Telehealth: Payer: Self-pay | Admitting: *Deleted

## 2023-08-13 ENCOUNTER — Inpatient Hospital Stay (HOSPITAL_BASED_OUTPATIENT_CLINIC_OR_DEPARTMENT_OTHER): Payer: No Typology Code available for payment source | Admitting: Gynecologic Oncology

## 2023-08-13 VITALS — BP 129/76 | HR 55 | Temp 99.0°F | Resp 16 | Ht 66.0 in | Wt 184.0 lb

## 2023-08-13 DIAGNOSIS — R0789 Other chest pain: Secondary | ICD-10-CM

## 2023-08-13 DIAGNOSIS — G8918 Other acute postprocedural pain: Secondary | ICD-10-CM

## 2023-08-13 DIAGNOSIS — R0602 Shortness of breath: Secondary | ICD-10-CM

## 2023-08-13 DIAGNOSIS — R1084 Generalized abdominal pain: Secondary | ICD-10-CM | POA: Insufficient documentation

## 2023-08-13 DIAGNOSIS — D398 Neoplasm of uncertain behavior of other specified female genital organs: Secondary | ICD-10-CM | POA: Diagnosis present

## 2023-08-13 DIAGNOSIS — N888 Other specified noninflammatory disorders of cervix uteri: Secondary | ICD-10-CM

## 2023-08-13 LAB — COMPREHENSIVE METABOLIC PANEL
ALT: 14 U/L (ref 0–44)
AST: 15 U/L (ref 15–41)
Albumin: 4.1 g/dL (ref 3.5–5.0)
Alkaline Phosphatase: 62 U/L (ref 38–126)
Anion gap: 3 — ABNORMAL LOW (ref 5–15)
BUN: 6 mg/dL (ref 6–20)
CO2: 30 mmol/L (ref 22–32)
Calcium: 9.7 mg/dL (ref 8.9–10.3)
Chloride: 109 mmol/L (ref 98–111)
Creatinine, Ser: 0.78 mg/dL (ref 0.44–1.00)
GFR, Estimated: >60 mL/min (ref >60)
Glucose, Bld: 100 mg/dL — ABNORMAL HIGH (ref 70–99)
Potassium: 4.1 mmol/L (ref 3.5–5.1)
Sodium: 142 mmol/L (ref 135–145)
Total Bilirubin: 0.4 mg/dL (ref 0.3–1.2)
Total Protein: 6.9 g/dL (ref 6.5–8.1)

## 2023-08-13 LAB — CBC (CANCER CENTER ONLY)
HCT: 36.1 % (ref 36.0–46.0)
Hemoglobin: 12.1 g/dL (ref 12.0–15.0)
MCH: 30.3 pg (ref 26.0–34.0)
MCHC: 33.5 g/dL (ref 30.0–36.0)
MCV: 90.5 fL (ref 80.0–100.0)
Platelet Count: 291 10*3/uL (ref 150–400)
RBC: 3.99 MIL/uL (ref 3.87–5.11)
RDW: 12.4 % (ref 11.5–15.5)
WBC Count: 7.7 10*3/uL (ref 4.0–10.5)
nRBC: 0 % (ref 0.0–0.2)

## 2023-08-13 MED ORDER — IOHEXOL 9 MG/ML PO SOLN
ORAL | Status: AC
  Filled 2023-08-13: qty 1000

## 2023-08-13 MED ORDER — IOHEXOL 350 MG/ML SOLN
100.0000 mL | Freq: Once | INTRAVENOUS | Status: AC | PRN
  Administered 2023-08-13: 100 mL via INTRAVENOUS

## 2023-08-13 MED ORDER — SODIUM CHLORIDE (PF) 0.9 % IJ SOLN
INTRAMUSCULAR | Status: AC
  Filled 2023-08-13: qty 50

## 2023-08-13 MED ORDER — IOHEXOL 9 MG/ML PO SOLN
500.0000 mL | ORAL | Status: AC
  Administered 2023-08-13 (×2): 500 mL via ORAL

## 2023-08-13 NOTE — Telephone Encounter (Signed)
Spoke with Carolyn White who called the office complaining of increased pain to her chest and radiating to shoulders.  Pt states she had a bowel movement last night and again this morning. After having a bowel movement this morning pt reports it hurts to take a deep in. Pt states her abdomin is distended, she is only taking tylenol and ibuprofen and rates her pain 9/10.  Pt denies fever, chills and all urinary symptoms. She also reports not having her appetite back and when she does try to eat she feels a little nauseous.  Advised Carolyn White to take a tramadol, continue to ambulate, place a heating pad on upper area of abdomen, hot mint herbal tea, and mylicon over the counter every 2-3 hours. And the office will call back and check on her symptoms.

## 2023-08-13 NOTE — Telephone Encounter (Signed)
Spoke with patient and relayed message from providers that she needs to come to the office for evaluation. Pt verbalized understanding and states when her daughter gets back she will bring her to the office by 1200.

## 2023-08-13 NOTE — Progress Notes (Addendum)
GYN Oncology Symptom Management  Date: 08/13/2023  Carolyn White is a 57 year old female who initially presented to the office on 07/02/2023 with a lower uterine segment/upper cervical mass. This was biopsied by Dr. Pricilla Holm with IR assistance/CT guidance on 07/15/23. Pathology returned with benign smooth muscle consistent with leiomyoma, negative for malignancy.   On 08/11/2023, she underwent robotic-assisted laparoscopic lysis of adhesions for approximately 20 minutes, total hysterectomy with bilateral salpingo-oophorectomy, enterolysis by Dr. Eugene Garnet. Her post-operative course was uneventful. Final pathology returned with: A. UTERUS, CERVIX, RIGHT FALLOPAIN TUBE AND OVARY, HYSTERECTOMY:      Cervix:           Unremarkable. Negative for dysplasia or malignancy.       Endocervix:           Nabothian cysts. Negative for hyperplasia, dysplasia or malignancy.       Endometrium:           Benign inactive endometrium. Negative for hyperplasia or malignancy.       Myometrium:           Cellular leiomyomata in the lower uterine segment (2.7 cm in maximal dimension). Adenomyosis. Negative for malignancy.       Serosa:           Focal endometriosis. Negative for malignancy.       Right fallopian tube:           Benign fimbriated fallopian tube. Negative for malignancy.       Right ovary:           Benign ovarian parenchyma with inclusion cysts. Negative for malignancy.  B. FALLOPIAN TUBE AND OVARY, LEFT, SALPINGO OOPHORECTOMY:      Left fallopian tube:           Benign fimbriated fallopian tube. Negative for malignancy.       Left ovary:           Benign ovarian parenchyma. Negative for malignancy.   CBC    Component Value Date/Time   WBC 7.7 08/13/2023 1043   WBC 8.7 08/05/2023 1437   RBC 3.99 08/13/2023 1043   HGB 12.1 08/13/2023 1043   HCT 36.1 08/13/2023 1043   PLT 291 08/13/2023 1043   MCV 90.5 08/13/2023 1043   MCH 30.3 08/13/2023 1043   MCHC 33.5 08/13/2023 1043   RDW  12.4 08/13/2023 1043   LYMPHSABS 2.8 08/03/2019 1049   MONOABS 0.9 08/03/2019 1049   EOSABS 0.4 08/03/2019 1049   BASOSABS 0.0 08/03/2019 1049    BMET    Component Value Date/Time   NA 142 08/13/2023 1043   K 4.1 08/13/2023 1043   CL 109 08/13/2023 1043   CO2 30 08/13/2023 1043   GLUCOSE 100 (H) 08/13/2023 1043   BUN 6 08/13/2023 1043   CREATININE 0.78 08/13/2023 1043   CALCIUM 9.7 08/13/2023 1043   GFRNONAA >60 08/13/2023 1043    Interval: Patient states that she felt okay the day of surgery and only needed to use Tylenol.  Yesterday she slept for a significant part of the day but states she remained hydrated.  She had a bowel movement last night at 10:30 PM and states she had severe abdominal pain in the rectum with this.  She states she could not breathe and had to sleep in the recliner.  She reports significant pressure and pain in her chest, shoulders, neck.  Reports pain with trying to take a deep breath.  She states her temperature the day after surgery got  to 99.3 and she felt chilled.  After having the painful bowel movement, she reported small amount of pink blood coming from the vagina noted when wiping but none since.  No vaginal discharge or drainage after this. She states she has not passed a significant amount of flatus.  States she is emptying her bladder without any issues. Feels like she can't catch her breath and is very concerned. No issues voiced with her abdominal incisions.  ROS intake form: positive for low appetite, shortness of breath, palpitations, abdominal distension, pelvic pain, anxiety, fatigue, chest pain, abdominal pain, dizziness, problem with walking due to shortness of breath  Exam: Alert, oriented, appears uncomfortable, mild shortness of breath at rest Lungs are clear but breaths are shallow due to chest discomfort on inspiration. Heart regular in rate and rhythm. Abdomen soft, active bowel sounds, moderate tenderness with palpation, mildly distended,  tympanic on percussion. Lap site incisions are healing well.     No lower extrem edema bilaterally.  Assessment/Plan: 57 year old s/p robotic-assisted laparoscopic lysis of adhesions for approximately 20 minutes, total hysterectomy with bilateral salpingo-oophorectomy, enterolysis by Dr. Eugene Garnet on 08/11/23 presenting to the office with moderate dyspnea, pain with defecation, abdominal pain. Given symptoms along with extensive lysis of adhesions during the time of surgery, will plan for CT scan of the abdomen and pelvis to assess for signs of bowel perforation/injury/cause of pain per Dr. Pricilla Holm. Given pulmonary symptoms and recent surgery, plan for CT angio to rule out PE. Scans are ordered for later today. Plan for chest plain film while we await CT scan scheduled for this afternoon. Above lab results discussed with patient. She will be contacted with the results and recommendations moving forward.

## 2023-08-13 NOTE — Patient Instructions (Addendum)
Your CBC and metabolic panel are normal.  Given the symptoms you are having, we will plan on obtaining a CT scan to further evaluate. We will also plan on you having a chest xray to start.  For the CT scan, nothing to eat or drink 4 hours before. Arrive two hours before to begin drinking oral contrast.

## 2023-08-16 ENCOUNTER — Other Ambulatory Visit: Payer: Self-pay | Admitting: Gynecologic Oncology

## 2023-08-16 ENCOUNTER — Telehealth: Payer: Self-pay | Admitting: *Deleted

## 2023-08-16 DIAGNOSIS — B3731 Acute candidiasis of vulva and vagina: Secondary | ICD-10-CM

## 2023-08-16 MED ORDER — FLUCONAZOLE 150 MG PO TABS
ORAL_TABLET | ORAL | 0 refills | Status: DC
Start: 2023-08-16 — End: 2023-09-07

## 2023-08-16 NOTE — Telephone Encounter (Signed)
Spoke with Carolyn White in regards to her Rx Diflucan 150 mg tablet. Advised patient to not take diflucan  with her lexapro. Pt states she takes her Lexapro at bedtime. Instructed pt to take Diflucan in the morning and wait three days to see how her symptoms are before taking the second tablet. If symptoms improve, she doesn't need to take the second tablet. Pt verbalized understanding.

## 2023-08-16 NOTE — Telephone Encounter (Signed)
Spoke with Ms. Blakeley and relayed message from Warner Mccreedy, NP that she will send a Rx in for Diflucan to take orally and continue to follow surgery restrictions including nothing per vagina for 10-12 weeks. Advice pt to call the office if symptoms persist. Pt verbalized understanding and thanked the office for calling.

## 2023-08-16 NOTE — Telephone Encounter (Addendum)
Spoke with Ms. Piet and relayed message from Warner Mccreedy, NP that CT scan showed expected surgery changes and gallstones were noted in gallbladder but they are not blocking anything and not causing inflammation.  Pt states she is no longer having any pain in her chest and none in her shoulders. She is passing gas and having bowel movements. Pt denies any urinary symptoms, fever and or chills. Pt states, "I think I may have a yeast infection" she is experiencing itching, redness and a creamy discharge. Advised patient her message would be forwarded to provider and the office would call back.  Pt verbalized understanding and thanked the office for calling.

## 2023-08-20 ENCOUNTER — Inpatient Hospital Stay (HOSPITAL_BASED_OUTPATIENT_CLINIC_OR_DEPARTMENT_OTHER): Payer: No Typology Code available for payment source | Admitting: Gynecologic Oncology

## 2023-08-20 ENCOUNTER — Encounter: Payer: Self-pay | Admitting: Gynecologic Oncology

## 2023-08-20 DIAGNOSIS — D259 Leiomyoma of uterus, unspecified: Secondary | ICD-10-CM

## 2023-08-20 DIAGNOSIS — N888 Other specified noninflammatory disorders of cervix uteri: Secondary | ICD-10-CM

## 2023-08-20 NOTE — Progress Notes (Signed)
Gynecologic Oncology Telehealth Note: Gyn-Onc  I connected with Carolyn White on 08/20/23 at  4:15 PM EDT by telephone and verified that I am speaking with the correct person using two identifiers.  I discussed the limitations, risks, security and privacy concerns of performing an evaluation and management service by telemedicine and the availability of in-person appointments. I also discussed with the patient that there may be a patient responsible charge related to this service. The patient expressed understanding and agreed to proceed.  Other persons participating in the visit and their role in the encounter: none.  Patient's location: home Provider's location: [facility]  Reason for Visit: follow-up  Treatment History: 08/11/23: Robotic-assisted laparoscopic lysis of adhesions for approximately 20 minutes, total hysterectomy with bilateral salpingo-oophorectomy, enterolysis   Interval History: Doing Carolyn. Abdomen still tender, improving. Pressure/pain with urinating, denies dysuria. Bowels moving, working not to strain too much. Appetite still low, eating smaller meals. Monday had some bright red blood. When wipes are voiding, sometimes pink. Not happening every time she voids. Breathing much better, able to take a deep breaths without issues.   Past Medical/Surgical History: Past Medical History:  Diagnosis Date   Arthritis    Back pain    Depression    GERD (gastroesophageal reflux disease)    Lichen sclerosus    Pneumonia    Thyroid disease    Vulvar dysplasia     Past Surgical History:  Procedure Laterality Date   ABDOMINOPLASTY     CESAREAN SECTION  1998   and in 2002   ECTOPIC PREGNANCY SURGERY Right 1997   ENDOMETRIAL ABLATION  2013   MENISCUS REPAIR Left 2004   ROBOTIC ASSISTED TOTAL HYSTERECTOMY WITH BILATERAL SALPINGO OOPHERECTOMY Bilateral 08/11/2023   Procedure: XI ROBOTIC ASSISTED TOTAL HYSTERECTOMY WITH BILATERAL SALPINGO OOPHORECTOMY;  Surgeon: Carver Fila,  MD;  Location: [facility];  Service: Gynecology;  Laterality: Bilateral;   TUBAL LIGATION  2013    Family History  Problem Relation Age of Onset   Irritable bowel syndrome Mother    Cancer Paternal Aunt    Breast cancer Paternal Aunt    Colon cancer Neg Hx    Ovarian cancer Neg Hx    Endometrial cancer Neg Hx    Pancreatic cancer Neg Hx    Prostate cancer Neg Hx     Social History   Socioeconomic History   Marital status: Widowed    Spouse name: Not on file   Number of children: Not on file   Years of education: Not on file   Highest education level: Not on file  Occupational History   Occupation: Dealer  Tobacco Use   Smoking status: Former    Types: Cigarettes   Smokeless tobacco: Not on file  Vaping Use   Vaping status: Some Days  Substance and Sexual Activity   Alcohol use: Not Currently   Drug use: Never   Sexual activity: Yes    Birth control/protection: None  Other Topics Concern   Not on file  Social History Narrative   Not on file   Social Determinants of Health   Financial Resource Strain: Not on file  Food Insecurity: No Food Insecurity (06/29/2023)   Hunger Vital Sign    Worried About Running Out of Food in the Last Year: Never true    Ran Out of Food in the Last Year: Never true  Transportation Needs: No Transportation Needs (06/29/2023)   PRAPARE - Administrator, Civil Service (Medical): No  Lack of Transportation (Non-Medical): No  Physical Activity: Not on file  Stress: Not on file  Social Connections: Unknown (04/07/2022)   Received from [facility]   Social Network    Social Network: Not on file    Current Medications:  Current Outpatient Medications:    escitalopram (LEXAPRO) 10 MG tablet, Take 10 mg by mouth daily., Disp: , Rfl:    famotidine-calcium carbonate-magnesium hydroxide (PEPCID COMPLETE) 10-800-165 MG chewable tablet, Chew 1 tablet by mouth daily., Disp: , Rfl:    fluconazole  (DIFLUCAN) 150 MG tablet, Take one tablet orally. Can repeat in 3 days if symptoms not improved, Disp: 2 tablet, Rfl: 0   levothyroxine (SYNTHROID) 88 MCG tablet, Take 88 mcg by mouth daily before breakfast., Disp: , Rfl:    senna-docusate (SENOKOT-S) 8.6-50 MG tablet, Take 2 tablets by mouth at bedtime. For AFTER surgery, do not take if having diarrhea, Disp: 30 tablet, Rfl: 0   traMADol (ULTRAM) 50 MG tablet, Take 1 tablet (50 mg total) by mouth every 6 (six) hours as needed for severe pain. For AFTER surgery only, do not take and drive, Disp: 10 tablet, Rfl: 0  Review of Symptoms: Pertinent positives as per HPI.  Physical Exam: Deferred given limitations of phone visit.  Laboratory & Radiologic Studies: None new  Assessment & Plan: Carolyn White is a 57 y.o. woman s/p robotic TLH/BSO for LUS segment mass with pathology confirming cellular leiomyomata.  Patient is overall having a slower recovery than anticipated but continues to progress.  Her breathing has improved significantly.  She is having small amount of vaginal spotting, overall decreasing.  Reviewed continued expectations and restrictions.  She will call if anything changes between now and her in person follow-up.  I discussed the assessment and treatment plan with the patient. The patient was provided with an opportunity to ask questions and all were answered. The patient agreed with the plan and demonstrated an understanding of the instructions.   The patient was advised to call back or see an in-person evaluation if the symptoms worsen or if the condition fails to improve as anticipated.   8 minutes of total time was spent for this patient encounter, including preparation, phone counseling with the patient and coordination of care, and documentation of the encounter.   Eugene Garnet, MD  Division of Gynecologic Oncology  Department of Obstetrics and Gynecology  [facility]

## 2023-09-10 ENCOUNTER — Other Ambulatory Visit: Payer: Self-pay

## 2023-09-10 ENCOUNTER — Encounter: Payer: Self-pay | Admitting: Gynecologic Oncology

## 2023-09-10 ENCOUNTER — Inpatient Hospital Stay: Payer: No Typology Code available for payment source | Attending: Gynecologic Oncology | Admitting: Gynecologic Oncology

## 2023-09-10 VITALS — BP 116/80 | HR 70 | Temp 98.3°F | Resp 20 | Wt 183.8 lb

## 2023-09-10 DIAGNOSIS — Z7189 Other specified counseling: Secondary | ICD-10-CM

## 2023-09-10 DIAGNOSIS — N888 Other specified noninflammatory disorders of cervix uteri: Secondary | ICD-10-CM

## 2023-09-10 DIAGNOSIS — D259 Leiomyoma of uterus, unspecified: Secondary | ICD-10-CM

## 2023-09-10 NOTE — Patient Instructions (Signed)
It was good to see you today.  You are continuing to heal well from surgery.  Please remember, no heavy lifting for 6 weeks after surgery.  Nothing in the vagina for at least 10 weeks.  We will see you back in 4-5 weeks to assure continued healing.

## 2023-09-10 NOTE — Progress Notes (Signed)
Gynecologic Oncology Return Clinic Visit  09/10/23  Reason for Visit: follow-up  Treatment History: The patient was recently seen by OB/GYN for vulvar lesion and postmenopausal bleeding.   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 This biopsy was performed at her new patient visit.  04/21/23: Returned for follow-up for larger vulvar excision and endometrial biopsy in the setting of PMB. EMB -fragments of negative ecto and endocervical tissue, predominantly mucus.  No endometrial tissue identified.  Excision of vulvar lesion shows benign skin having reactive changes consistent with prior biopsy.  No residual dysplasia or malignancy.   Ultrasound from [facility] on 04/21/2023: There is measures 5.8 x 3.9 x 2.7 cm with an endometrial lining of 1.5 mm.  Bilateral ovaries normal in appearance.  There is a 2.4 cm heterogenous focal mass with increased vascularity in the lower uterine segment/cervix.  Possible subserosal fibroid versus cervical mass.   Pelvic MRI on 7/16 shows a 2 x 2.4 x 2.6 cm enhancing lesion along the right lateral aspect of the lower uterine segment/cervix.  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  Adjacent susceptibility artifact related to low anterior C-section.  No pelvic ascites.  Normal ovaries.  No evidence of metastatic disease.   The patient reports overall doing well.  Her history is notable for abnormal Pap smears with a LEEP performed in 2015 per her report.  She endorses having a Pap smear in 2023 or 2024 that was normal.  Her menses stopped in 2013 after her endometrial ablation.    07/15/23: CT guided transcervical biopsy of LUS/upper cervical mass with IR. Pathology: Benign smooth muscle c/w leiomyoma, no malignancy.  08/11/23: Robotic-assisted laparoscopic lysis of adhesions for approximately 20 minutes, total hysterectomy with bilateral salpingo-oophorectomy, enterolysis   Patient was seen on postop day 2 with significant abdominal pain as well as  shortness of breath.  Extensive workup was performed and negative for pulmonary embolism as well as intra-abdominal process.  Interval History: Patient is doing well, recovery has been slower than she anticipated but getting better with each day.  Still has some discomfort in her deep pelvis intermittently with bowel movements or when she urinates although does not happen all the time.  She denies any vaginal bleeding or discharge.  Abdomen is becoming less sore, still hurts mostly with movement such as position change or rolling in bed.  She endorses a good appetite without nausea or emesis.  Endorses regular bowel movements.  Past Medical/Surgical History: Past Medical History:  Diagnosis Date   Arthritis    Back pain    Depression    GERD (gastroesophageal reflux disease)    Lichen sclerosus    Pneumonia    Thyroid disease    Vulvar dysplasia     Past Surgical History:  Procedure Laterality Date   ABDOMINOPLASTY     CESAREAN SECTION  1998   and in 2002   ECTOPIC PREGNANCY SURGERY Right 1997   ENDOMETRIAL ABLATION  2013   MENISCUS REPAIR Left 2004   ROBOTIC ASSISTED TOTAL HYSTERECTOMY WITH BILATERAL SALPINGO OOPHERECTOMY Bilateral 08/11/2023   Procedure: XI ROBOTIC ASSISTED TOTAL HYSTERECTOMY WITH BILATERAL SALPINGO OOPHORECTOMY;  Surgeon: Carver Fila, MD;  Location: [facility];  Service: Gynecology;  Laterality: Bilateral;   TUBAL LIGATION  2013    Family History  Problem Relation Age of Onset   Irritable bowel syndrome Mother    Cancer Paternal Aunt    Breast cancer Paternal Aunt    Colon cancer Neg Hx    Ovarian  cancer Neg Hx    Endometrial cancer Neg Hx    Pancreatic cancer Neg Hx    Prostate cancer Neg Hx     Social History   Socioeconomic History   Marital status: Widowed    Spouse name: Not on file   Number of children: Not on file   Years of education: Not on file   Highest education level: Not on file  Occupational History   Occupation: Barista  Tobacco Use   Smoking status: Former    Types: Cigarettes   Smokeless tobacco: Not on file  Vaping Use   Vaping status: Some Days  Substance and Sexual Activity   Alcohol use: Not Currently   Drug use: Never   Sexual activity: Yes    Birth control/protection: None  Other Topics Concern   Not on file  Social History Narrative   Not on file   Social Determinants of Health   Financial Resource Strain: Not on file  Food Insecurity: No Food Insecurity (06/29/2023)   Hunger Vital Sign    Worried About Running Out of Food in the Last Year: Never true    Ran Out of Food in the Last Year: Never true  Transportation Needs: No Transportation Needs (06/29/2023)   PRAPARE - Administrator, Civil Service (Medical): No    Lack of Transportation (Non-Medical): No  Physical Activity: Not on file  Stress: Not on file  Social Connections: Unknown (04/07/2022)   Received from [facility]   Social Network    Social Network: Not on file    Current Medications:  Current Outpatient Medications:    escitalopram (LEXAPRO) 10 MG tablet, Take 10 mg by mouth daily., Disp: , Rfl:    famotidine-calcium carbonate-magnesium hydroxide (PEPCID COMPLETE) 10-800-165 MG chewable tablet, Chew 1 tablet by mouth daily., Disp: , Rfl:    levothyroxine (SYNTHROID) 88 MCG tablet, Take 88 mcg by mouth daily before breakfast., Disp: , Rfl:   Review of Systems: Denies appetite changes, fevers, chills, fatigue, unexplained weight changes. Denies hearing loss, neck lumps or masses, mouth sores, ringing in ears or voice changes. Denies cough or wheezing.  Denies shortness of breath. Denies chest pain or palpitations. Denies leg swelling. Denies abdominal distention, pain, blood in stools, constipation, diarrhea, nausea, vomiting, or early satiety. Denies pain with intercourse, dysuria, frequency, hematuria or incontinence. Denies hot flashes, pelvic pain, vaginal bleeding or  vaginal discharge.   Denies joint pain, back pain or muscle pain/cramps. Denies itching, rash, or wounds. Denies dizziness, headaches, numbness or seizures. Denies swollen lymph nodes or glands, denies easy bruising or bleeding. Denies anxiety, depression, confusion, or decreased concentration.  Physical Exam: BP 116/80 (BP Location: Right Arm, Patient Position: Sitting)   Pulse 70   Temp 98.3 F (36.8 C) (Oral)   Resp 20   Wt 183 lb 12.8 oz (83.4 kg)   SpO2 98%   BMI 29.67 kg/m  General: Alert, oriented, no acute distress. HEENT: Atraumatic, normocephalic, sclera anicteric. Chest: Clear to auscultation bilaterally.  No wheezes or rhonchi. Abdomen: soft, nontender.  Normoactive bowel sounds.  No masses or hepatosplenomegaly appreciated.  Well-healed incisions. Extremities: Grossly normal range of motion.  Warm, well perfused.  No edema bilaterally. GU: Normal appearing external genitalia without erythema, excoriation, or lesions.  Speculum exam reveals cuff intact, suture visible, no blood or discharge.  Bimanual exam reveals cuff intact, no fluctuance or tenderness to palpation.    Laboratory & Radiologic Studies: A. UTERUS, CERVIX, RIGHT  FALLOPAIN TUBE AND OVARY, HYSTERECTOMY:      Cervix:           Unremarkable.           Negative for dysplasia or malignancy.       Endocervix:           Nabothian cysts.           Negative for hyperplasia, dysplasia or malignancy.       Endometrium:           Benign inactive endometrium.           Negative for hyperplasia or malignancy.       Myometrium:           Cellular leiomyomata in the lower uterine segment (2.7 cm in maximal dimension).           Adenomyosis           Negative for malignancy.       Serosa:           Focal endometriosis.           Negative for malignancy.       Right fallopian tube:           Benign fimbriated fallopian tube.           Negative for malignancy.       Right ovary:           Benign ovarian  parenchyma with inclusion cysts.           Negative for malignancy.  B. FALLOPIAN TUBE AND OVARY, LEFT, SALPINGO OOPHORECTOMY:      Left fallopian tube:           Benign fimbriated fallopian tube.           Negative for malignancy.       Left ovary:           Benign ovarian parenchyma.           Negative for malignancy.   Assessment & Plan: Carolyn White is a 57 y.o. woman s/p TRH/BSO, LOA for lower uterine segment mass with final pathology showing cellular leiomyoma, focal endometriosis on the uterine serosa.  Patient is overall doing well, slower recovery than anticipated.  Discussed continued expectations and restrictions.  Plan to see her in 4-5 weeks to assure continued progress.  Would like to return to work.  She was given a letter today that she can return with lifting restrictions until 6 weeks.  Discussed pathology from surgery.  She was given a copy of her pathology report which we reviewed together.  Discussed diagnosis of cellular fibroma.  Given total hysterectomy without morcellation, I do not think she needs any specific follow-up but can continue to see her OB/GYN yearly for follow-up as well as continued surveillance given her history of high-grade vulvar dysplasia.  20 minutes of total time was spent for this patient encounter, including preparation, face-to-face counseling with the patient and coordination of care, and documentation of the encounter.  Eugene Garnet, MD  Division of Gynecologic Oncology  Department of Obstetrics and Gynecology  [facility]

## 2023-10-11 ENCOUNTER — Telehealth: Payer: Self-pay

## 2023-10-11 NOTE — Telephone Encounter (Signed)
Per Warner Mccreedy NP,I reached out to Carolyn White to reschedule her appointment  on 11/22 up to 11/19. PT states she needs late PM appointment. Pt is scheduled for 11/19 @ 3:30pm.

## 2023-10-12 ENCOUNTER — Inpatient Hospital Stay: Payer: No Typology Code available for payment source | Attending: Gynecologic Oncology | Admitting: Gynecologic Oncology

## 2023-10-12 VITALS — BP 132/80 | HR 75 | Temp 98.2°F | Resp 20 | Wt 188.6 lb

## 2023-10-12 DIAGNOSIS — N888 Other specified noninflammatory disorders of cervix uteri: Secondary | ICD-10-CM

## 2023-10-12 DIAGNOSIS — Z90722 Acquired absence of ovaries, bilateral: Secondary | ICD-10-CM

## 2023-10-12 DIAGNOSIS — D259 Leiomyoma of uterus, unspecified: Secondary | ICD-10-CM

## 2023-10-12 DIAGNOSIS — Z90710 Acquired absence of both cervix and uterus: Secondary | ICD-10-CM

## 2023-10-12 NOTE — Progress Notes (Signed)
Gynecologic Oncology Return Clinic Visit  10/12/2023  Reason for Visit: follow-up  Treatment History: The patient was recently seen by OB/GYN for vulvar lesion and postmenopausal bleeding.   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 This biopsy was performed at her new patient visit.  04/21/23: Returned for follow-up for larger vulvar excision and endometrial biopsy in the setting of PMB. EMB -fragments of negative ecto and endocervical tissue, predominantly mucus.  No endometrial tissue identified.  Excision of vulvar lesion shows benign skin having reactive changes consistent with prior biopsy.  No residual dysplasia or malignancy.   Ultrasound from [facility] on 04/21/2023: There is measures 5.8 x 3.9 x 2.7 cm with an endometrial lining of 1.5 mm.  Bilateral ovaries normal in appearance.  There is a 2.4 cm heterogenous focal mass with increased vascularity in the lower uterine segment/cervix.  Possible subserosal fibroid versus cervical mass.   Pelvic MRI on 7/16 shows a 2 x 2.4 x 2.6 cm enhancing lesion along the right lateral aspect of the lower uterine segment/cervix.  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  Adjacent susceptibility artifact related to low anterior C-section.  No pelvic ascites.  Normal ovaries.  No evidence of metastatic disease.   The patient reports overall doing well.  Her history is notable for abnormal Pap smears with a LEEP performed in 2015 per her report.  She endorses having a Pap smear in 2023 or 2024 that was normal.  Her menses stopped in 2013 after her endometrial ablation.    07/15/23: CT guided transcervical biopsy of LUS/upper cervical mass with IR. Pathology: Benign smooth muscle c/w leiomyoma, no malignancy.  08/11/23: Robotic-assisted laparoscopic lysis of adhesions for approximately 20 minutes, total hysterectomy with bilateral salpingo-oophorectomy, enterolysis   Patient was seen on postop day 2 with significant abdominal pain as well as  shortness of breath.  Extensive workup was performed and negative for pulmonary embolism as well as intra-abdominal process.  She was last seen in the office by Dr. Pricilla Holm on 09/10/2023 with reported deep pelvic discomfort reported intermittently.  Interval History: She presents to the office for continued post-operative follow up. She reports overall doing well with previous symptoms improving. She is back at work. No abdominal/pelvic pain reported. Having normal bowel movements. Tolerating her diet with no nausea or emesis. She reports gaining weight. She does have mild pressure with voiding, more at end of stream but does not feel she has a UTI. No dysuria or hematuria.  Past Medical/Surgical History: Past Medical History:  Diagnosis Date   Arthritis    Back pain    Depression    GERD (gastroesophageal reflux disease)    Lichen sclerosus    Pneumonia    Thyroid disease    Vulvar dysplasia     Past Surgical History:  Procedure Laterality Date   ABDOMINOPLASTY     CESAREAN SECTION  1998   and in 2002   ECTOPIC PREGNANCY SURGERY Right 1997   ENDOMETRIAL ABLATION  2013   MENISCUS REPAIR Left 2004   ROBOTIC ASSISTED TOTAL HYSTERECTOMY WITH BILATERAL SALPINGO OOPHERECTOMY Bilateral 08/11/2023   Procedure: XI ROBOTIC ASSISTED TOTAL HYSTERECTOMY WITH BILATERAL SALPINGO OOPHORECTOMY;  Surgeon: Carver Fila, MD;  Location: [facility];  Service: Gynecology;  Laterality: Bilateral;   TUBAL LIGATION  2013    Family History  Problem Relation Age of Onset   Irritable bowel syndrome Mother    Cancer Paternal Aunt    Breast cancer Paternal Aunt    Colon cancer Neg  Hx    Ovarian cancer Neg Hx    Endometrial cancer Neg Hx    Pancreatic cancer Neg Hx    Prostate cancer Neg Hx     Social History   Socioeconomic History   Marital status: Widowed    Spouse name: Not on file   Number of children: Not on file   Years of education: Not on file   Highest education level: Not on file   Occupational History   Occupation: Dealer  Tobacco Use   Smoking status: Former    Types: Cigarettes   Smokeless tobacco: Not on file  Vaping Use   Vaping status: Some Days  Substance and Sexual Activity   Alcohol use: Not Currently   Drug use: Never   Sexual activity: Yes    Birth control/protection: None  Other Topics Concern   Not on file  Social History Narrative   Not on file   Social Determinants of Health   Financial Resource Strain: Not on file  Food Insecurity: No Food Insecurity (06/29/2023)   Hunger Vital Sign    Worried About Running Out of Food in the Last Year: Never true    Ran Out of Food in the Last Year: Never true  Transportation Needs: No Transportation Needs (06/29/2023)   PRAPARE - Administrator, Civil Service (Medical): No    Lack of Transportation (Non-Medical): No  Physical Activity: Not on file  Stress: Not on file  Social Connections: Unknown (04/07/2022)   Received from [facility]   Social Network    Social Network: Not on file    Current Medications:  Current Outpatient Medications:    escitalopram (LEXAPRO) 10 MG tablet, Take 10 mg by mouth daily., Disp: , Rfl:    famotidine-calcium carbonate-magnesium hydroxide (PEPCID COMPLETE) 10-800-165 MG chewable tablet, Chew 1 tablet by mouth daily., Disp: , Rfl:    levothyroxine (SYNTHROID) 88 MCG tablet, Take 88 mcg by mouth daily before breakfast., Disp: , Rfl:   Review of Systems: See interval. +weight gain, mild pressure more at end of urine stream  Physical Exam: BP 132/80   Pulse 75   Temp 98.2 F (36.8 C) (Oral)   Resp 20   Wt 188 lb 9.6 oz (85.5 kg)   SpO2 100%   BMI 30.44 kg/m  General: Alert, oriented, no acute distress. HEENT: Atraumatic, normocephalic, sclera anicteric. Chest: Unlabored. Clear to auscultation bilaterally.  No wheezes or rhonchi. Abdomen: soft, nontender. Well-healed incisions without erythema or  drainage. Extremities: Grossly normal range of motion. No edema.  Laboratory & Radiologic Studies: A. UTERUS, CERVIX, RIGHT FALLOPAIN TUBE AND OVARY, HYSTERECTOMY:      Cervix:           Unremarkable.           Negative for dysplasia or malignancy.       Endocervix:           Nabothian cysts.           Negative for hyperplasia, dysplasia or malignancy.       Endometrium:           Benign inactive endometrium.           Negative for hyperplasia or malignancy.       Myometrium:           Cellular leiomyomata in the lower uterine segment (2.7 cm in maximal dimension).           Adenomyosis  Negative for malignancy.       Serosa:           Focal endometriosis.           Negative for malignancy.       Right fallopian tube:           Benign fimbriated fallopian tube.           Negative for malignancy.       Right ovary:           Benign ovarian parenchyma with inclusion cysts.           Negative for malignancy.  B. FALLOPIAN TUBE AND OVARY, LEFT, SALPINGO OOPHORECTOMY:      Left fallopian tube:           Benign fimbriated fallopian tube.           Negative for malignancy.       Left ovary:           Benign ovarian parenchyma.           Negative for malignancy.   Assessment & Plan: Carolyn White is a 57 y.o. woman s/p TRH/BSO, LOA for lower uterine segment mass with final pathology showing cellular leiomyoma, focal endometriosis on the uterine serosa.  Patient is overall doing well and meeting post-operative milestones. No follow up needed at this time with GYN Oncology unless needs arise in the future. She is advised to follow up with her PCP and GYN for her continued well woman care and annual pap smears. She will talk with her PCP about her weight gain and will look into testing of her thyroid etc. She is advised to call for development of urinary symptoms, worsening pressure for sample to be obtained if needed.  10 minutes of total time was spent for this patient  encounter, including preparation, face-to-face counseling with the patient and coordination of care, and documentation of the encounter.  Warner Mccreedy NP [facility]

## 2023-10-15 ENCOUNTER — Ambulatory Visit: Payer: No Typology Code available for payment source | Admitting: Gynecologic Oncology
